# Patient Record
Sex: Female | Born: 1955
Health system: Southern US, Community
[De-identification: ages and names within clinical notes are randomized; demographics above are authoritative.]

## PROBLEM LIST (undated history)

## (undated) DIAGNOSIS — T8859XA Other complications of anesthesia, initial encounter: Secondary | ICD-10-CM

## (undated) DIAGNOSIS — T4145XA Adverse effect of unspecified anesthetic, initial encounter: Secondary | ICD-10-CM

## (undated) DIAGNOSIS — K219 Gastro-esophageal reflux disease without esophagitis: Secondary | ICD-10-CM

## (undated) DIAGNOSIS — R0600 Dyspnea, unspecified: Secondary | ICD-10-CM

## (undated) DIAGNOSIS — R928 Other abnormal and inconclusive findings on diagnostic imaging of breast: Secondary | ICD-10-CM

## (undated) DIAGNOSIS — R7303 Prediabetes: Secondary | ICD-10-CM

## (undated) DIAGNOSIS — R112 Nausea with vomiting, unspecified: Secondary | ICD-10-CM

## (undated) DIAGNOSIS — I10 Essential (primary) hypertension: Secondary | ICD-10-CM

## (undated) DIAGNOSIS — R062 Wheezing: Secondary | ICD-10-CM

## (undated) DIAGNOSIS — Z9889 Other specified postprocedural states: Secondary | ICD-10-CM

## (undated) HISTORY — PX: APPENDECTOMY: SHX54

## (undated) HISTORY — PX: ABDOMINAL HYSTERECTOMY: SHX81

## (undated) HISTORY — PX: BREAST CYST EXCISION: SHX579

## (undated) HISTORY — PX: BREAST BIOPSY: SHX20

## (undated) HISTORY — PX: KNEE SURGERY: SHX244

## (undated) HISTORY — PX: SHOULDER SURGERY: SHX246

## (undated) HISTORY — PX: OVARIAN CYST SURGERY: SHX726

## (undated) HISTORY — PX: ANKLE FRACTURE SURGERY: SHX122

---

## 2000-02-16 ENCOUNTER — Other Ambulatory Visit: Admission: RE | Admit: 2000-02-16 | Discharge: 2000-02-16 | Payer: Self-pay | Admitting: Gynecology

## 2003-02-13 ENCOUNTER — Other Ambulatory Visit: Admission: RE | Admit: 2003-02-13 | Discharge: 2003-02-13 | Payer: Self-pay | Admitting: Gynecology

## 2009-07-05 ENCOUNTER — Ambulatory Visit (HOSPITAL_COMMUNITY): Admission: RE | Admit: 2009-07-05 | Discharge: 2009-07-05 | Payer: Self-pay | Admitting: Gynecology

## 2015-09-17 ENCOUNTER — Other Ambulatory Visit: Payer: Self-pay

## 2015-09-17 DIAGNOSIS — Z1231 Encounter for screening mammogram for malignant neoplasm of breast: Secondary | ICD-10-CM

## 2015-10-29 ENCOUNTER — Ambulatory Visit
Admission: RE | Admit: 2015-10-29 | Discharge: 2015-10-29 | Disposition: A | Discharge status: Home / Self Care | Payer: BLUE CROSS/BLUE SHIELD | Source: Ambulatory Visit

## 2015-10-29 DIAGNOSIS — Z1231 Encounter for screening mammogram for malignant neoplasm of breast: Secondary | ICD-10-CM

## 2016-05-25 DIAGNOSIS — H6981 Other specified disorders of Eustachian tube, right ear: Secondary | ICD-10-CM | POA: Diagnosis not present

## 2016-05-25 DIAGNOSIS — H9311 Tinnitus, right ear: Secondary | ICD-10-CM | POA: Diagnosis not present

## 2016-05-25 DIAGNOSIS — H9190 Unspecified hearing loss, unspecified ear: Secondary | ICD-10-CM | POA: Diagnosis not present

## 2016-05-25 DIAGNOSIS — H918X1 Other specified hearing loss, right ear: Secondary | ICD-10-CM | POA: Diagnosis not present

## 2016-05-25 DIAGNOSIS — K219 Gastro-esophageal reflux disease without esophagitis: Secondary | ICD-10-CM | POA: Diagnosis not present

## 2016-09-01 DIAGNOSIS — D539 Nutritional anemia, unspecified: Secondary | ICD-10-CM | POA: Diagnosis not present

## 2016-09-01 DIAGNOSIS — K210 Gastro-esophageal reflux disease with esophagitis: Secondary | ICD-10-CM | POA: Diagnosis not present

## 2016-09-01 DIAGNOSIS — R1013 Epigastric pain: Secondary | ICD-10-CM | POA: Diagnosis not present

## 2016-09-10 DIAGNOSIS — D649 Anemia, unspecified: Secondary | ICD-10-CM | POA: Diagnosis not present

## 2016-09-10 DIAGNOSIS — R1011 Right upper quadrant pain: Secondary | ICD-10-CM | POA: Diagnosis not present

## 2016-09-10 DIAGNOSIS — R1013 Epigastric pain: Secondary | ICD-10-CM | POA: Diagnosis not present

## 2016-09-10 DIAGNOSIS — K625 Hemorrhage of anus and rectum: Secondary | ICD-10-CM | POA: Diagnosis not present

## 2016-09-15 DIAGNOSIS — D485 Neoplasm of uncertain behavior of skin: Secondary | ICD-10-CM | POA: Diagnosis not present

## 2016-09-15 DIAGNOSIS — L728 Other follicular cysts of the skin and subcutaneous tissue: Secondary | ICD-10-CM | POA: Diagnosis not present

## 2016-09-15 DIAGNOSIS — D0359 Melanoma in situ of other part of trunk: Secondary | ICD-10-CM | POA: Diagnosis not present

## 2016-09-15 DIAGNOSIS — D225 Melanocytic nevi of trunk: Secondary | ICD-10-CM | POA: Diagnosis not present

## 2016-09-15 DIAGNOSIS — D2239 Melanocytic nevi of other parts of face: Secondary | ICD-10-CM | POA: Diagnosis not present

## 2016-09-16 DIAGNOSIS — K2950 Unspecified chronic gastritis without bleeding: Secondary | ICD-10-CM | POA: Diagnosis not present

## 2016-09-16 DIAGNOSIS — K317 Polyp of stomach and duodenum: Secondary | ICD-10-CM | POA: Diagnosis not present

## 2016-09-16 DIAGNOSIS — R1013 Epigastric pain: Secondary | ICD-10-CM | POA: Diagnosis not present

## 2016-09-18 DIAGNOSIS — R1011 Right upper quadrant pain: Secondary | ICD-10-CM | POA: Diagnosis not present

## 2016-09-30 DIAGNOSIS — M7741 Metatarsalgia, right foot: Secondary | ICD-10-CM | POA: Diagnosis not present

## 2016-09-30 DIAGNOSIS — M24572 Contracture, left ankle: Secondary | ICD-10-CM | POA: Diagnosis not present

## 2016-09-30 DIAGNOSIS — M79671 Pain in right foot: Secondary | ICD-10-CM | POA: Diagnosis not present

## 2016-09-30 DIAGNOSIS — M79672 Pain in left foot: Secondary | ICD-10-CM | POA: Diagnosis not present

## 2016-09-30 DIAGNOSIS — M24571 Contracture, right ankle: Secondary | ICD-10-CM | POA: Diagnosis not present

## 2016-10-01 DIAGNOSIS — R1010 Upper abdominal pain, unspecified: Secondary | ICD-10-CM | POA: Diagnosis not present

## 2016-10-01 DIAGNOSIS — K589 Irritable bowel syndrome without diarrhea: Secondary | ICD-10-CM | POA: Diagnosis not present

## 2016-10-01 DIAGNOSIS — D649 Anemia, unspecified: Secondary | ICD-10-CM | POA: Diagnosis not present

## 2016-10-02 DIAGNOSIS — D649 Anemia, unspecified: Secondary | ICD-10-CM | POA: Diagnosis not present

## 2016-10-05 DIAGNOSIS — R1909 Other intra-abdominal and pelvic swelling, mass and lump: Secondary | ICD-10-CM | POA: Diagnosis not present

## 2016-10-07 DIAGNOSIS — Z23 Encounter for immunization: Secondary | ICD-10-CM | POA: Diagnosis not present

## 2016-10-29 DIAGNOSIS — K219 Gastro-esophageal reflux disease without esophagitis: Secondary | ICD-10-CM | POA: Diagnosis not present

## 2016-10-29 DIAGNOSIS — J320 Chronic maxillary sinusitis: Secondary | ICD-10-CM | POA: Diagnosis not present

## 2016-11-17 ENCOUNTER — Other Ambulatory Visit: Payer: Self-pay | Admitting: Family Medicine

## 2016-11-17 DIAGNOSIS — Z1231 Encounter for screening mammogram for malignant neoplasm of breast: Secondary | ICD-10-CM

## 2016-12-10 ENCOUNTER — Ambulatory Visit
Admission: RE | Admit: 2016-12-10 | Discharge: 2016-12-10 | Disposition: A | Discharge status: Home / Self Care | Payer: 59 | Source: Ambulatory Visit | Attending: Family Medicine | Admitting: Family Medicine

## 2016-12-10 ENCOUNTER — Ambulatory Visit: Payer: BLUE CROSS/BLUE SHIELD

## 2016-12-10 DIAGNOSIS — Z1231 Encounter for screening mammogram for malignant neoplasm of breast: Secondary | ICD-10-CM

## 2017-01-22 DIAGNOSIS — J0120 Acute ethmoidal sinusitis, unspecified: Secondary | ICD-10-CM | POA: Diagnosis not present

## 2017-01-22 DIAGNOSIS — Z6837 Body mass index (BMI) 37.0-37.9, adult: Secondary | ICD-10-CM | POA: Diagnosis not present

## 2017-01-22 DIAGNOSIS — J309 Allergic rhinitis, unspecified: Secondary | ICD-10-CM | POA: Diagnosis not present

## 2017-03-12 DIAGNOSIS — D500 Iron deficiency anemia secondary to blood loss (chronic): Secondary | ICD-10-CM | POA: Diagnosis not present

## 2017-03-15 DIAGNOSIS — R102 Pelvic and perineal pain: Secondary | ICD-10-CM | POA: Diagnosis not present

## 2017-03-15 DIAGNOSIS — Z6837 Body mass index (BMI) 37.0-37.9, adult: Secondary | ICD-10-CM | POA: Diagnosis not present

## 2017-03-18 DIAGNOSIS — K589 Irritable bowel syndrome without diarrhea: Secondary | ICD-10-CM | POA: Diagnosis not present

## 2017-03-18 DIAGNOSIS — D500 Iron deficiency anemia secondary to blood loss (chronic): Secondary | ICD-10-CM | POA: Diagnosis not present

## 2017-03-18 DIAGNOSIS — K219 Gastro-esophageal reflux disease without esophagitis: Secondary | ICD-10-CM | POA: Diagnosis not present

## 2017-04-20 DIAGNOSIS — K648 Other hemorrhoids: Secondary | ICD-10-CM | POA: Diagnosis not present

## 2017-05-10 DIAGNOSIS — Z6836 Body mass index (BMI) 36.0-36.9, adult: Secondary | ICD-10-CM | POA: Diagnosis not present

## 2017-05-10 DIAGNOSIS — G4733 Obstructive sleep apnea (adult) (pediatric): Secondary | ICD-10-CM | POA: Diagnosis not present

## 2017-05-10 DIAGNOSIS — G4700 Insomnia, unspecified: Secondary | ICD-10-CM | POA: Diagnosis not present

## 2017-05-10 DIAGNOSIS — R531 Weakness: Secondary | ICD-10-CM | POA: Diagnosis not present

## 2017-05-25 DIAGNOSIS — Z6836 Body mass index (BMI) 36.0-36.9, adult: Secondary | ICD-10-CM | POA: Diagnosis not present

## 2017-05-25 DIAGNOSIS — N760 Acute vaginitis: Secondary | ICD-10-CM | POA: Diagnosis not present

## 2017-05-25 DIAGNOSIS — N898 Other specified noninflammatory disorders of vagina: Secondary | ICD-10-CM | POA: Diagnosis not present

## 2017-06-18 DIAGNOSIS — Z01419 Encounter for gynecological examination (general) (routine) without abnormal findings: Secondary | ICD-10-CM | POA: Diagnosis not present

## 2017-06-18 DIAGNOSIS — Z6837 Body mass index (BMI) 37.0-37.9, adult: Secondary | ICD-10-CM | POA: Diagnosis not present

## 2017-06-18 DIAGNOSIS — Z0000 Encounter for general adult medical examination without abnormal findings: Secondary | ICD-10-CM | POA: Diagnosis not present

## 2017-06-18 DIAGNOSIS — Z1272 Encounter for screening for malignant neoplasm of vagina: Secondary | ICD-10-CM | POA: Diagnosis not present

## 2017-06-21 ENCOUNTER — Other Ambulatory Visit: Payer: Self-pay | Admitting: Obstetrics & Gynecology

## 2017-06-21 DIAGNOSIS — N63 Unspecified lump in unspecified breast: Secondary | ICD-10-CM

## 2017-06-24 ENCOUNTER — Ambulatory Visit
Admission: RE | Admit: 2017-06-24 | Discharge: 2017-06-24 | Disposition: A | Discharge status: Home / Self Care | Payer: BLUE CROSS/BLUE SHIELD | Source: Ambulatory Visit | Attending: Obstetrics & Gynecology | Admitting: Obstetrics & Gynecology

## 2017-06-24 DIAGNOSIS — N6489 Other specified disorders of breast: Secondary | ICD-10-CM | POA: Diagnosis not present

## 2017-06-24 DIAGNOSIS — N63 Unspecified lump in unspecified breast: Secondary | ICD-10-CM

## 2017-06-24 DIAGNOSIS — R928 Other abnormal and inconclusive findings on diagnostic imaging of breast: Secondary | ICD-10-CM | POA: Diagnosis not present

## 2017-07-01 DIAGNOSIS — Z8041 Family history of malignant neoplasm of ovary: Secondary | ICD-10-CM | POA: Diagnosis not present

## 2017-07-01 DIAGNOSIS — Z6837 Body mass index (BMI) 37.0-37.9, adult: Secondary | ICD-10-CM | POA: Diagnosis not present

## 2017-07-26 DIAGNOSIS — J40 Bronchitis, not specified as acute or chronic: Secondary | ICD-10-CM | POA: Diagnosis not present

## 2017-07-26 DIAGNOSIS — J329 Chronic sinusitis, unspecified: Secondary | ICD-10-CM | POA: Diagnosis not present

## 2017-07-26 DIAGNOSIS — J309 Allergic rhinitis, unspecified: Secondary | ICD-10-CM | POA: Diagnosis not present

## 2017-07-26 DIAGNOSIS — Z6838 Body mass index (BMI) 38.0-38.9, adult: Secondary | ICD-10-CM | POA: Diagnosis not present

## 2017-08-11 DIAGNOSIS — Z6837 Body mass index (BMI) 37.0-37.9, adult: Secondary | ICD-10-CM | POA: Diagnosis not present

## 2017-08-11 DIAGNOSIS — Z8041 Family history of malignant neoplasm of ovary: Secondary | ICD-10-CM | POA: Diagnosis not present

## 2017-08-24 DIAGNOSIS — G4733 Obstructive sleep apnea (adult) (pediatric): Secondary | ICD-10-CM | POA: Diagnosis not present

## 2017-09-08 DIAGNOSIS — D500 Iron deficiency anemia secondary to blood loss (chronic): Secondary | ICD-10-CM | POA: Diagnosis not present

## 2017-09-14 DIAGNOSIS — K589 Irritable bowel syndrome without diarrhea: Secondary | ICD-10-CM | POA: Diagnosis not present

## 2017-09-14 DIAGNOSIS — K219 Gastro-esophageal reflux disease without esophagitis: Secondary | ICD-10-CM | POA: Diagnosis not present

## 2017-09-14 DIAGNOSIS — D500 Iron deficiency anemia secondary to blood loss (chronic): Secondary | ICD-10-CM | POA: Diagnosis not present

## 2017-09-14 DIAGNOSIS — Z1212 Encounter for screening for malignant neoplasm of rectum: Secondary | ICD-10-CM | POA: Diagnosis not present

## 2017-10-04 DIAGNOSIS — Z23 Encounter for immunization: Secondary | ICD-10-CM | POA: Diagnosis not present

## 2017-10-04 DIAGNOSIS — G4733 Obstructive sleep apnea (adult) (pediatric): Secondary | ICD-10-CM | POA: Diagnosis not present

## 2017-10-04 DIAGNOSIS — Z6839 Body mass index (BMI) 39.0-39.9, adult: Secondary | ICD-10-CM | POA: Diagnosis not present

## 2017-10-04 DIAGNOSIS — I10 Essential (primary) hypertension: Secondary | ICD-10-CM | POA: Diagnosis not present

## 2017-10-04 DIAGNOSIS — G4700 Insomnia, unspecified: Secondary | ICD-10-CM | POA: Diagnosis not present

## 2017-12-07 DIAGNOSIS — F5119 Other hypersomnia not due to a substance or known physiological condition: Secondary | ICD-10-CM | POA: Diagnosis not present

## 2017-12-08 DIAGNOSIS — F5119 Other hypersomnia not due to a substance or known physiological condition: Secondary | ICD-10-CM | POA: Diagnosis not present

## 2017-12-10 DIAGNOSIS — J329 Chronic sinusitis, unspecified: Secondary | ICD-10-CM | POA: Diagnosis not present

## 2017-12-10 DIAGNOSIS — J40 Bronchitis, not specified as acute or chronic: Secondary | ICD-10-CM | POA: Diagnosis not present

## 2017-12-10 DIAGNOSIS — G4700 Insomnia, unspecified: Secondary | ICD-10-CM | POA: Diagnosis not present

## 2017-12-10 DIAGNOSIS — Z6839 Body mass index (BMI) 39.0-39.9, adult: Secondary | ICD-10-CM | POA: Diagnosis not present

## 2018-01-14 ENCOUNTER — Other Ambulatory Visit: Payer: Self-pay | Admitting: Family Medicine

## 2018-01-14 DIAGNOSIS — Z1231 Encounter for screening mammogram for malignant neoplasm of breast: Secondary | ICD-10-CM

## 2018-01-17 ENCOUNTER — Ambulatory Visit
Admission: RE | Admit: 2018-01-17 | Discharge: 2018-01-17 | Disposition: A | Discharge status: Home / Self Care | Payer: BLUE CROSS/BLUE SHIELD | Source: Ambulatory Visit | Attending: Family Medicine | Admitting: Family Medicine

## 2018-01-17 DIAGNOSIS — Z1231 Encounter for screening mammogram for malignant neoplasm of breast: Secondary | ICD-10-CM | POA: Diagnosis not present

## 2018-01-18 ENCOUNTER — Other Ambulatory Visit: Payer: Self-pay | Admitting: Family Medicine

## 2018-01-18 DIAGNOSIS — R928 Other abnormal and inconclusive findings on diagnostic imaging of breast: Secondary | ICD-10-CM

## 2018-01-20 DIAGNOSIS — M79652 Pain in left thigh: Secondary | ICD-10-CM | POA: Diagnosis not present

## 2018-01-20 DIAGNOSIS — S82832A Other fracture of upper and lower end of left fibula, initial encounter for closed fracture: Secondary | ICD-10-CM | POA: Diagnosis not present

## 2018-01-20 DIAGNOSIS — M25552 Pain in left hip: Secondary | ICD-10-CM | POA: Diagnosis not present

## 2018-01-20 DIAGNOSIS — W010XXA Fall on same level from slipping, tripping and stumbling without subsequent striking against object, initial encounter: Secondary | ICD-10-CM | POA: Diagnosis not present

## 2018-01-20 DIAGNOSIS — M25572 Pain in left ankle and joints of left foot: Secondary | ICD-10-CM | POA: Diagnosis not present

## 2018-01-20 DIAGNOSIS — S79922A Unspecified injury of left thigh, initial encounter: Secondary | ICD-10-CM | POA: Diagnosis not present

## 2018-01-20 DIAGNOSIS — S79912A Unspecified injury of left hip, initial encounter: Secondary | ICD-10-CM | POA: Diagnosis not present

## 2018-01-20 DIAGNOSIS — S8262XA Displaced fracture of lateral malleolus of left fibula, initial encounter for closed fracture: Secondary | ICD-10-CM | POA: Diagnosis not present

## 2018-01-21 ENCOUNTER — Other Ambulatory Visit: Payer: BLUE CROSS/BLUE SHIELD

## 2018-01-21 DIAGNOSIS — S82402A Unspecified fracture of shaft of left fibula, initial encounter for closed fracture: Secondary | ICD-10-CM | POA: Diagnosis not present

## 2018-01-21 DIAGNOSIS — S82832A Other fracture of upper and lower end of left fibula, initial encounter for closed fracture: Secondary | ICD-10-CM | POA: Diagnosis not present

## 2018-01-24 DIAGNOSIS — R52 Pain, unspecified: Secondary | ICD-10-CM | POA: Diagnosis not present

## 2018-01-24 DIAGNOSIS — M79609 Pain in unspecified limb: Secondary | ICD-10-CM | POA: Diagnosis not present

## 2018-01-24 DIAGNOSIS — S82842A Displaced bimalleolar fracture of left lower leg, initial encounter for closed fracture: Secondary | ICD-10-CM | POA: Diagnosis not present

## 2018-01-24 DIAGNOSIS — Z01818 Encounter for other preprocedural examination: Secondary | ICD-10-CM | POA: Diagnosis not present

## 2018-01-26 DIAGNOSIS — R262 Difficulty in walking, not elsewhere classified: Secondary | ICD-10-CM | POA: Diagnosis not present

## 2018-01-26 DIAGNOSIS — E669 Obesity, unspecified: Secondary | ICD-10-CM | POA: Diagnosis not present

## 2018-01-26 DIAGNOSIS — Z79899 Other long term (current) drug therapy: Secondary | ICD-10-CM | POA: Diagnosis not present

## 2018-01-26 DIAGNOSIS — G8918 Other acute postprocedural pain: Secondary | ICD-10-CM | POA: Diagnosis not present

## 2018-01-26 DIAGNOSIS — Z01818 Encounter for other preprocedural examination: Secondary | ICD-10-CM | POA: Diagnosis not present

## 2018-01-26 DIAGNOSIS — S82842A Displaced bimalleolar fracture of left lower leg, initial encounter for closed fracture: Secondary | ICD-10-CM | POA: Diagnosis not present

## 2018-01-26 DIAGNOSIS — Z6839 Body mass index (BMI) 39.0-39.9, adult: Secondary | ICD-10-CM | POA: Diagnosis not present

## 2018-01-26 DIAGNOSIS — I10 Essential (primary) hypertension: Secondary | ICD-10-CM | POA: Diagnosis not present

## 2018-01-26 DIAGNOSIS — X501XXA Overexertion from prolonged static or awkward postures, initial encounter: Secondary | ICD-10-CM | POA: Diagnosis not present

## 2018-02-08 DIAGNOSIS — S82842A Displaced bimalleolar fracture of left lower leg, initial encounter for closed fracture: Secondary | ICD-10-CM | POA: Diagnosis not present

## 2018-03-08 DIAGNOSIS — S82842D Displaced bimalleolar fracture of left lower leg, subsequent encounter for closed fracture with routine healing: Secondary | ICD-10-CM | POA: Diagnosis not present

## 2018-03-11 DIAGNOSIS — M25572 Pain in left ankle and joints of left foot: Secondary | ICD-10-CM | POA: Diagnosis not present

## 2018-03-16 ENCOUNTER — Other Ambulatory Visit: Payer: BLUE CROSS/BLUE SHIELD

## 2018-03-16 DIAGNOSIS — S62134A Nondisplaced fracture of capitate [os magnum] bone, right wrist, initial encounter for closed fracture: Secondary | ICD-10-CM | POA: Diagnosis not present

## 2018-03-17 DIAGNOSIS — R2689 Other abnormalities of gait and mobility: Secondary | ICD-10-CM | POA: Diagnosis not present

## 2018-03-17 DIAGNOSIS — M6281 Muscle weakness (generalized): Secondary | ICD-10-CM | POA: Diagnosis not present

## 2018-03-17 DIAGNOSIS — M25572 Pain in left ankle and joints of left foot: Secondary | ICD-10-CM | POA: Diagnosis not present

## 2018-03-18 ENCOUNTER — Ambulatory Visit
Admission: RE | Admit: 2018-03-18 | Discharge: 2018-03-18 | Disposition: A | Discharge status: Home / Self Care | Payer: BLUE CROSS/BLUE SHIELD | Source: Ambulatory Visit | Attending: Family Medicine | Admitting: Family Medicine

## 2018-03-18 ENCOUNTER — Other Ambulatory Visit: Payer: Self-pay | Admitting: Family Medicine

## 2018-03-18 DIAGNOSIS — R928 Other abnormal and inconclusive findings on diagnostic imaging of breast: Secondary | ICD-10-CM

## 2018-03-18 DIAGNOSIS — N651 Disproportion of reconstructed breast: Secondary | ICD-10-CM | POA: Diagnosis not present

## 2018-03-18 DIAGNOSIS — N6489 Other specified disorders of breast: Secondary | ICD-10-CM

## 2018-03-22 ENCOUNTER — Ambulatory Visit
Admission: RE | Admit: 2018-03-22 | Discharge: 2018-03-22 | Disposition: A | Discharge status: Home / Self Care | Payer: BLUE CROSS/BLUE SHIELD | Source: Ambulatory Visit | Attending: Family Medicine | Admitting: Family Medicine

## 2018-03-22 ENCOUNTER — Other Ambulatory Visit: Payer: Self-pay | Admitting: Family Medicine

## 2018-03-22 DIAGNOSIS — N6489 Other specified disorders of breast: Secondary | ICD-10-CM | POA: Diagnosis not present

## 2018-03-22 DIAGNOSIS — N6011 Diffuse cystic mastopathy of right breast: Secondary | ICD-10-CM | POA: Diagnosis not present

## 2018-03-23 DIAGNOSIS — M6281 Muscle weakness (generalized): Secondary | ICD-10-CM | POA: Diagnosis not present

## 2018-03-23 DIAGNOSIS — R2689 Other abnormalities of gait and mobility: Secondary | ICD-10-CM | POA: Diagnosis not present

## 2018-03-23 DIAGNOSIS — M25572 Pain in left ankle and joints of left foot: Secondary | ICD-10-CM | POA: Diagnosis not present

## 2018-03-29 ENCOUNTER — Other Ambulatory Visit: Payer: Self-pay | Admitting: General Surgery

## 2018-03-29 DIAGNOSIS — R928 Other abnormal and inconclusive findings on diagnostic imaging of breast: Secondary | ICD-10-CM

## 2018-03-29 DIAGNOSIS — Z79899 Other long term (current) drug therapy: Secondary | ICD-10-CM | POA: Diagnosis not present

## 2018-03-29 DIAGNOSIS — Z87898 Personal history of other specified conditions: Secondary | ICD-10-CM | POA: Diagnosis not present

## 2018-03-30 DIAGNOSIS — M85861 Other specified disorders of bone density and structure, right lower leg: Secondary | ICD-10-CM | POA: Diagnosis not present

## 2018-03-30 DIAGNOSIS — E2839 Other primary ovarian failure: Secondary | ICD-10-CM | POA: Diagnosis not present

## 2018-03-30 DIAGNOSIS — M85851 Other specified disorders of bone density and structure, right thigh: Secondary | ICD-10-CM | POA: Diagnosis not present

## 2018-03-31 ENCOUNTER — Encounter (HOSPITAL_BASED_OUTPATIENT_CLINIC_OR_DEPARTMENT_OTHER): Payer: Self-pay | Admitting: *Deleted

## 2018-03-31 DIAGNOSIS — M6281 Muscle weakness (generalized): Secondary | ICD-10-CM | POA: Diagnosis not present

## 2018-03-31 DIAGNOSIS — M25572 Pain in left ankle and joints of left foot: Secondary | ICD-10-CM | POA: Diagnosis not present

## 2018-03-31 DIAGNOSIS — R2689 Other abnormalities of gait and mobility: Secondary | ICD-10-CM | POA: Diagnosis not present

## 2018-04-01 NOTE — H&P (View-Only) (Signed)
Victoria Lang Documented: 03/29/2018 8:51 AM Location: [facility] Patient #: [id number] DOB: 16-May-1956 Married / Language: Cleophus Molt / Race: White Female   History of Present Illness [name] MD; 03/29/2018 9:45 AM) The patient is a 62 year old female who presents with a complaint of Breast problems. Pt is a lovely 62 yo M referred for consultation by Dr. Jimmye Norman for an abnormal right mammogram. She had screening detected architectural distortion which persisted on diagnostic mammogram. U/S was negative. Core needle biopsy showed fibrocystic tissue and was discordant. She presents for discussion. She has not had prior breast complaints. She has a sister who has had breast cancer. She has no personal cancer history. She has had a colon polyp in the past. She is in a boot on the left for an ankle fracture in march. She had surgery at that time which delayed her dx mammogram.  dx mammogram 03/18/18 ACR Breast Density Category b: There are scattered areas of fibroglandular density.  FINDINGS: An area of questionable distortion in the superior slightly lateral right breast at middle depth persists on today's additional spot and 3D views. Further evaluation of this area was performed with ultrasound.  Mammographic images were processed with CAD.  Targeted ultrasound is performed, showing no definite sonographic correlate for the area of distortion seen mammographically. Evaluation of the entire superior aspect was performed. Evaluation of the right axilla demonstrates no suspicious lymphadenopathy.  IMPRESSION: 1. Persistent right breast distortion without ultrasound correlate. Recommendation is for stereotactic biopsy. 2. No suspicious right axillary lymphadenopathy.  RECOMMENDATION: Stereotactic biopsy of the right breast.  I have discussed the findings and recommendations with the patient. Results were also provided in writing at the conclusion of the visit.  If applicable, a reminder letter will be sent to the patient regarding the next appointment.  BI-RADS CATEGORY 4: Suspicious.  pathology 03/22/18 Diagnosis Breast, right, needle core biopsy, upper outer - FIBROCYSTIC CHANGE WITH CALCIFICATIONS. - NO MALIGNANCY IDENTIFIED.   Past Surgical History (Tanisha A. Owens Shark, [place]; 03/29/2018 8:52 AM) Appendectomy  Breast Biopsy  Right. Cesarean Section - 1  Colon Polyp Removal - Colonoscopy  Foot Surgery  Left. Hysterectomy (not due to cancer) - Partial  Knee Surgery  Left. Shoulder Surgery  Bilateral.  Diagnostic Studies History (Tanisha A. Owens Shark, [facility]; 03/29/2018 8:51 AM) Colonoscopy  within last year Mammogram  within last year Pap Smear  >5 years ago  Allergies (Tanisha A. Owens Shark, [place]; 03/29/2018 8:54 AM) No Known Drug Allergies [03/29/2018]: Allergies Reconciled   Medication History (Tanisha A. Owens Shark, [place]; 03/29/2018 8:55 AM) AmLODIPine Besylate (5MG  Tablet, Oral) Active. Aspirin (325MG  Tablet DR, Oral) Active. Estradiol (1MG  Tablet, Oral) Active. Pantoprazole Sodium (40MG  Tablet DR, Oral) Active. Vitamin D (Ergocalciferol) (50000UNIT Capsule, Oral) Active. HydroCHLOROthiazide (12.5MG  Tablet, Oral) Active. Vitamin B12 (1000MCG Tablet ER, Oral) Active. Medications Reconciled  Social History (Tanisha A. Owens Shark, [place]; 03/29/2018 8:52 AM) Caffeine use  Carbonated beverages, Tea. No alcohol use  No drug use  Tobacco use  Never smoker.  Family History (Tanisha A. Owens Shark, [place]; 03/29/2018 8:52 AM) Alcohol Abuse  Father. Arthritis  Mother, Sister. Breast Cancer  Sister. Cancer  Mother, Sister. Colon Polyps  Brother. Diabetes Mellitus  Brother, Father, Mother, Sister. Heart Disease  Mother, Sister. Hypertension  Mother, Sister. Kidney Disease  Mother. Migraine Headache  Mother, Sister. Respiratory Condition  Brother, Sister. Thyroid problems  Sister.  Pregnancy / Birth History (Tanisha A. Owens Shark,  [place]; 03/29/2018 8:52 AM) Age at menarche  12 years. Age of menopause  68-55 Gravida  3 Maternal age  90-30 Para  2  Other Problems (Tanisha A. Owens Shark, [place]; 03/29/2018 8:52 AM) Back Pain  Diverticulosis  Gastric Ulcer  Gastroesophageal Reflux Disease  General anesthesia - complications  Hemorrhoids  High blood pressure  Hypercholesterolemia  Melanoma  Oophorectomy  Left. Sleep Apnea     Review of Systems (Tanisha A. Brown RMA; 03/29/2018 8:52 AM) General Present- Fatigue. Not Present- Appetite Loss, Chills, Fever, Night Sweats, Weight Gain and Weight Loss. Skin Present- Dryness. Not Present- Change in Wart/Mole, Hives, Jaundice, New Lesions, Non-Healing Wounds, Rash and Ulcer. HEENT Present- Wears glasses/contact lenses. Not Present- Earache, Hearing Loss, Hoarseness, Nose Bleed, Oral Ulcers, Ringing in the Ears, Seasonal Allergies, Sinus Pain, Sore Throat, Visual Disturbances and Yellow Eyes. Respiratory Not Present- Bloody sputum, Chronic Cough, Difficulty Breathing, Snoring and Wheezing. Breast Not Present- Breast Mass, Breast Pain, Nipple Discharge and Skin Changes. Cardiovascular Present- Swelling of Extremities. Not Present- Chest Pain, Difficulty Breathing Lying Down, Leg Cramps, Palpitations, Rapid Heart Rate and Shortness of Breath. Gastrointestinal Present- Hemorrhoids. Not Present- Abdominal Pain, Bloating, Bloody Stool, Change in Bowel Habits, Chronic diarrhea, Constipation, Difficulty Swallowing, Excessive gas, Gets full quickly at meals, Indigestion, Nausea, Rectal Pain and Vomiting. Female Genitourinary Not Present- Frequency, Nocturia, Painful Urination, Pelvic Pain and Urgency. Musculoskeletal Present- Back Pain. Not Present- Joint Pain, Joint Stiffness, Muscle Pain, Muscle Weakness and Swelling of Extremities. Neurological Not Present- Decreased Memory, Fainting, Headaches, Numbness, Seizures, Tingling, Tremor, Trouble walking and Weakness. Psychiatric  Not Present- Anxiety, Bipolar, Change in Sleep Pattern, Depression, Fearful and Frequent crying. Endocrine Present- Heat Intolerance. Not Present- Cold Intolerance, Excessive Hunger, Hair Changes, Hot flashes and New Diabetes. Hematology Present- Easy Bruising. Not Present- Blood Thinners, Excessive bleeding, Gland problems, HIV and Persistent Infections.  Vitals (Tanisha A. Brown RMA; 03/29/2018 8:52 AM) 03/29/2018 8:52 AM Weight: 224.8 lb Height: 65in Body Surface Area: 2.08 m Body Mass Index: 37.41 kg/m  Temp.: 97.51F  Pulse: 105 (Regular)  BP: 132/84 (Sitting, Left Arm, Standard)       Physical Exam [name] MD; 03/29/2018 9:45 AM) General Mental Status-Alert. General Appearance-Consistent with stated age. Hydration-Well hydrated. Voice-Normal.  Head and Neck Head-normocephalic, atraumatic with no lesions or palpable masses. Trachea-midline. Thyroid Gland Characteristics - normal size and consistency.  Eye Eyeball - Bilateral-Extraocular movements intact. Sclera/Conjunctiva - Bilateral-No scleral icterus.  Chest and Lung Exam Chest and lung exam reveals -quiet, even and easy respiratory effort with no use of accessory muscles and on auscultation, normal breath sounds, no adventitious sounds and normal vocal resonance. Inspection Chest Wall - Normal. Back - normal.  Breast Note: right breast with some bruising upper outer quadrant. no palpable masses. no nipple retraction or skin dimpling. no nipple discharge. no LAD. Left breast without positive findings.   Cardiovascular Cardiovascular examination reveals -normal heart sounds, regular rate and rhythm with no murmurs and normal pedal pulses bilaterally.  Abdomen Inspection Inspection of the abdomen reveals - No Hernias. Palpation/Percussion Palpation and Percussion of the abdomen reveal - Soft, Non Tender, No Rebound tenderness, No Rigidity (guarding) and No  hepatosplenomegaly. Auscultation Auscultation of the abdomen reveals - Bowel sounds normal.  Neurologic Neurologic evaluation reveals -alert and oriented x 3 with no impairment of recent or remote memory. Mental Status-Normal.  Musculoskeletal Global Assessment -Note: no gross deformities.  Normal Exam - Left-Upper Extremity Strength Normal and Lower Extremity Strength Normal. Normal Exam - Right-Upper Extremity Strength Normal and Lower Extremity Strength Normal. Note: left lower leg in walking boot  Lymphatic Head & Neck  General Head & Neck Lymphatics: Bilateral - Description - Normal. Axillary  General Axillary Region: Bilateral - Description - Normal. Tenderness - Non Tender. Femoral & Inguinal  Generalized Femoral & Inguinal Lymphatics: Bilateral - Description - No Generalized lymphadenopathy.    Assessment & Plan [name] MD; 03/29/2018 9:47 AM) ABNORMAL MAMMOGRAM OF RIGHT BREAST (R92.8) Impression: Pt has a discordant needle biopsy with abnormal right mammogram. We will plan seed localized excisional biopsy. She desires to have this done as soon as possible.  The surgical procedure was described to the patient. I discussed the incision type and location and that we would need radiology involved on with a wire or seed marker and/or sentinel node.  The risks and benefits of the procedure were described to the patient and she wishes to proceed.  We discussed the risks bleeding, infection, damage to other structures, need for further procedures/surgeries. We discussed the risk of seroma. The patient was advised if the area in the breast in cancer, we may need to go back to surgery for additional tissue to obtain negative margins or for a lymph node biopsy. The patient was advised that these are the most common complications, but that others can occur as well. They were advised against taking aspirin or other anti-inflammatory agents/blood thinners the week  before surgery. Current Plans Pt Education - [facility] Breast Biopsy HCI: discussed with patient and provided information. Schedule for Surgery HISTORY OF POSTOPERATIVE NAUSEA AND VOMITING (L27.517) Impression: anesthesia consult to discuss CURRENT USE OF ESTROGEN THERAPY (G01.749) Impression: Advised that if she has cancer we will recommend stopping this.    Signed by [name], MD (03/29/2018 9:47 AM)

## 2018-04-01 NOTE — H&P (Signed)
Victoria Lang Documented: 03/29/2018 8:51 AM Location: [facility] Patient #: [id number] DOB: 04-25-1956 Married / Language: Victoria Lang / Race: White Female   History of Present Illness Victoria Klein MD; 03/29/2018 9:45 AM) The patient is a 62 year old female who presents with a complaint of Breast problems. Pt is a lovely 62 yo M referred for consultation by Dr. Jimmye Lang for an abnormal right mammogram. She had screening detected architectural distortion which persisted on diagnostic mammogram. U/S was negative. Core needle biopsy showed fibrocystic tissue and was discordant. She presents for discussion. She has not had prior breast complaints. She has a sister who has had breast cancer. She has no personal cancer history. She has had a colon polyp in the past. She is in a boot on the left for an ankle fracture in march. She had surgery at that time which delayed her dx mammogram.  dx mammogram 03/18/18 ACR Breast Density Category b: There are scattered areas of fibroglandular density.  FINDINGS: An area of questionable distortion in the superior slightly lateral right breast at middle depth persists on today's additional spot and 3D views. Further evaluation of this area was performed with ultrasound.  Mammographic images were processed with CAD.  Targeted ultrasound is performed, showing no definite sonographic correlate for the area of distortion seen mammographically. Evaluation of the entire superior aspect was performed. Evaluation of the right axilla demonstrates no suspicious lymphadenopathy.  IMPRESSION: 1. Persistent right breast distortion without ultrasound correlate. Recommendation is for stereotactic biopsy. 2. No suspicious right axillary lymphadenopathy.  RECOMMENDATION: Stereotactic biopsy of the right breast.  I have discussed the findings and recommendations with the patient. Results were also provided in writing at the conclusion of the visit.  If applicable, a reminder letter will be sent to the patient regarding the next appointment.  BI-RADS CATEGORY 4: Suspicious.  pathology 03/22/18 Diagnosis Breast, right, needle core biopsy, upper outer - FIBROCYSTIC CHANGE WITH CALCIFICATIONS. - NO MALIGNANCY IDENTIFIED.   Past Surgical History (Victoria Lang, [place]; 03/29/2018 8:52 AM) Appendectomy  Breast Biopsy  Right. Cesarean Section - 1  Colon Polyp Removal - Colonoscopy  Foot Surgery  Left. Hysterectomy (not due to cancer) - Partial  Knee Surgery  Left. Shoulder Surgery  Bilateral.  Diagnostic Studies History (Victoria Lang, [place]; 03/29/2018 8:51 AM) Colonoscopy  within last year Mammogram  within last year Pap Smear  >5 years ago  Allergies (Victoria Lang, [place]; 03/29/2018 8:54 AM) No Known Drug Allergies [03/29/2018]: Allergies Reconciled   Medication History (Victoria Lang, [place]; 03/29/2018 8:55 AM) AmLODIPine Besylate (5MG  Tablet, Oral) Active. Aspirin (325MG  Tablet DR, Oral) Active. Estradiol (1MG  Tablet, Oral) Active. Pantoprazole Sodium (40MG  Tablet DR, Oral) Active. Vitamin D (Ergocalciferol) (50000UNIT Capsule, Oral) Active. HydroCHLOROthiazide (12.5MG  Tablet, Oral) Active. Vitamin B12 (1000MCG Tablet ER, Oral) Active. Medications Reconciled  Social History (Victoria Lang, [place]; 03/29/2018 8:52 AM) Caffeine use  Carbonated beverages, Tea. No alcohol use  No drug use  Tobacco use  Never smoker.  Family History (Victoria Lang, [place]; 03/29/2018 8:52 AM) Alcohol Abuse  Father. Arthritis  Mother, Sister. Breast Cancer  Sister. Cancer  Mother, Sister. Colon Polyps  Brother. Diabetes Mellitus  Brother, Father, Mother, Sister. Heart Disease  Mother, Sister. Hypertension  Mother, Sister. Kidney Disease  Mother. Migraine Headache  Mother, Sister. Respiratory Condition  Brother, Sister. Thyroid problems  Sister.  Pregnancy / Birth History (Victoria Lang,  [place]; 03/29/2018 8:52 AM) Age at menarche  40 years. Age of menopause  32-55 Gravida  3 Maternal age  58-30 Para  2  Other Problems (Victoria Lang, [place]; 03/29/2018 8:52 AM) Back Pain  Diverticulosis  Gastric Ulcer  Gastroesophageal Reflux Disease  General anesthesia - complications  Hemorrhoids  High blood pressure  Hypercholesterolemia  Melanoma  Oophorectomy  Left. Sleep Apnea     Review of Systems (Victoria Lang; 03/29/2018 8:52 AM) General Present- Fatigue. Not Present- Appetite Loss, Chills, Fever, Night Sweats, Weight Gain and Weight Loss. Skin Present- Dryness. Not Present- Change in Wart/Mole, Hives, Jaundice, New Lesions, Non-Healing Wounds, Rash and Ulcer. HEENT Present- Wears glasses/contact lenses. Not Present- Earache, Hearing Loss, Hoarseness, Nose Bleed, Oral Ulcers, Ringing in the Ears, Seasonal Allergies, Sinus Pain, Sore Throat, Visual Disturbances and Yellow Eyes. Respiratory Not Present- Bloody sputum, Chronic Cough, Difficulty Breathing, Snoring and Wheezing. Breast Not Present- Breast Mass, Breast Pain, Nipple Discharge and Skin Changes. Cardiovascular Present- Swelling of Extremities. Not Present- Chest Pain, Difficulty Breathing Lying Down, Leg Cramps, Palpitations, Rapid Heart Rate and Shortness of Breath. Gastrointestinal Present- Hemorrhoids. Not Present- Abdominal Pain, Bloating, Bloody Stool, Change in Bowel Habits, Chronic diarrhea, Constipation, Difficulty Swallowing, Excessive gas, Gets full quickly at meals, Indigestion, Nausea, Rectal Pain and Vomiting. Female Genitourinary Not Present- Frequency, Nocturia, Painful Urination, Pelvic Pain and Urgency. Musculoskeletal Present- Back Pain. Not Present- Joint Pain, Joint Stiffness, Muscle Pain, Muscle Weakness and Swelling of Extremities. Neurological Not Present- Decreased Memory, Fainting, Headaches, Numbness, Seizures, Tingling, Tremor, Trouble walking and Weakness. Psychiatric  Not Present- Anxiety, Bipolar, Change in Sleep Pattern, Depression, Fearful and Frequent crying. Endocrine Present- Heat Intolerance. Not Present- Cold Intolerance, Excessive Hunger, Hair Changes, Hot flashes and New Diabetes. Hematology Present- Easy Bruising. Not Present- Blood Thinners, Excessive bleeding, Gland problems, HIV and Persistent Infections.  Vitals (Victoria Lang; 03/29/2018 8:52 AM) 03/29/2018 8:52 AM Weight: 224.8 lb Height: 65in Body Surface Area: 2.08 m Body Mass Index: 37.41 kg/m  Temp.: 97.86F  Pulse: 105 (Regular)  BP: 132/84 (Sitting, Left Arm, Standard)       Physical Exam Victoria Klein MD; 03/29/2018 9:45 AM) General Mental Status-Alert. General Appearance-Consistent with stated age. Hydration-Well hydrated. Voice-Normal.  Head and Neck Head-normocephalic, atraumatic with no lesions or palpable masses. Trachea-midline. Thyroid Gland Characteristics - normal size and consistency.  Eye Eyeball - Bilateral-Extraocular movements intact. Sclera/Conjunctiva - Bilateral-No scleral icterus.  Chest and Lung Exam Chest and lung exam reveals -quiet, even and easy respiratory effort with no use of accessory muscles and on auscultation, normal breath sounds, no adventitious sounds and normal vocal resonance. Inspection Chest Wall - Normal. Back - normal.  Breast Note: right breast with some bruising upper outer quadrant. no palpable masses. no nipple retraction or skin dimpling. no nipple discharge. no LAD. Left breast without positive findings.   Cardiovascular Cardiovascular examination reveals -normal heart sounds, regular rate and rhythm with no murmurs and normal pedal pulses bilaterally.  Abdomen Inspection Inspection of the abdomen reveals - No Hernias. Palpation/Percussion Palpation and Percussion of the abdomen reveal - Soft, Non Tender, No Rebound tenderness, No Rigidity (guarding) and No  hepatosplenomegaly. Auscultation Auscultation of the abdomen reveals - Bowel sounds normal.  Neurologic Neurologic evaluation reveals -alert and oriented x 3 with no impairment of recent or remote memory. Mental Status-Normal.  Musculoskeletal Global Assessment -Note: no gross deformities.  Normal Exam - Left-Upper Extremity Strength Normal and Lower Extremity Strength Normal. Normal Exam - Right-Upper Extremity Strength Normal and Lower Extremity Strength Normal. Note: left lower leg in walking boot  Lymphatic Head & Neck  General Head & Neck Lymphatics: Bilateral - Description - Normal. Axillary  General Axillary Region: Bilateral - Description - Normal. Tenderness - Non Tender. Femoral & Inguinal  Generalized Femoral & Inguinal Lymphatics: Bilateral - Description - No Generalized lymphadenopathy.    Assessment & Plan Victoria Klein MD; 03/29/2018 9:47 AM) ABNORMAL MAMMOGRAM OF RIGHT BREAST (R92.8) Impression: Pt has a discordant needle biopsy with abnormal right mammogram. We will plan seed localized excisional biopsy. She desires to have this done as soon as possible.  The surgical procedure was described to the patient. I discussed the incision type and location and that we would need radiology involved on with a wire or seed marker and/or sentinel node.  The risks and benefits of the procedure were described to the patient and she wishes to proceed.  We discussed the risks bleeding, infection, damage to other structures, need for further procedures/surgeries. We discussed the risk of seroma. The patient was advised if the area in the breast in cancer, we may need to go back to surgery for additional tissue to obtain negative margins or for a lymph node biopsy. The patient was advised that these are the most common complications, but that others can occur as well. They were advised against taking aspirin or other anti-inflammatory agents/blood thinners the week  before surgery. Current Plans Pt Education - [facility] Breast Biopsy HCI: discussed with patient and provided information. Schedule for Surgery HISTORY OF POSTOPERATIVE NAUSEA AND VOMITING (Z76.734) Impression: anesthesia consult to discuss CURRENT USE OF ESTROGEN THERAPY (L93.790) Impression: Advised that if she has cancer we will recommend stopping this.    Signed by Victoria Klein, MD (03/29/2018 9:47 AM)

## 2018-04-06 ENCOUNTER — Inpatient Hospital Stay: Admission: RE | Admit: 2018-04-06 | Payer: BLUE CROSS/BLUE SHIELD | Source: Ambulatory Visit

## 2018-04-06 DIAGNOSIS — H6980 Other specified disorders of Eustachian tube, unspecified ear: Secondary | ICD-10-CM | POA: Diagnosis not present

## 2018-04-06 DIAGNOSIS — R42 Dizziness and giddiness: Secondary | ICD-10-CM | POA: Diagnosis not present

## 2018-04-06 DIAGNOSIS — Z6837 Body mass index (BMI) 37.0-37.9, adult: Secondary | ICD-10-CM | POA: Diagnosis not present

## 2018-04-06 DIAGNOSIS — R300 Dysuria: Secondary | ICD-10-CM | POA: Diagnosis not present

## 2018-04-07 ENCOUNTER — Ambulatory Visit (HOSPITAL_BASED_OUTPATIENT_CLINIC_OR_DEPARTMENT_OTHER)
Admission: RE | Admit: 2018-04-07 | Payer: BLUE CROSS/BLUE SHIELD | Source: Ambulatory Visit | Admitting: General Surgery

## 2018-04-07 DIAGNOSIS — R300 Dysuria: Secondary | ICD-10-CM | POA: Diagnosis not present

## 2018-04-07 HISTORY — DX: Gastro-esophageal reflux disease without esophagitis: K21.9

## 2018-04-07 HISTORY — DX: Essential (primary) hypertension: I10

## 2018-04-07 SURGERY — RADIOACTIVE SEED GUIDED BREAST BIOPSY
Anesthesia: General | Site: Breast | Laterality: Right

## 2018-04-13 DIAGNOSIS — M25572 Pain in left ankle and joints of left foot: Secondary | ICD-10-CM | POA: Diagnosis not present

## 2018-04-13 DIAGNOSIS — M6281 Muscle weakness (generalized): Secondary | ICD-10-CM | POA: Diagnosis not present

## 2018-04-13 DIAGNOSIS — R2689 Other abnormalities of gait and mobility: Secondary | ICD-10-CM | POA: Diagnosis not present

## 2018-04-18 DIAGNOSIS — R2689 Other abnormalities of gait and mobility: Secondary | ICD-10-CM | POA: Diagnosis not present

## 2018-04-18 DIAGNOSIS — M25572 Pain in left ankle and joints of left foot: Secondary | ICD-10-CM | POA: Diagnosis not present

## 2018-04-18 DIAGNOSIS — M6281 Muscle weakness (generalized): Secondary | ICD-10-CM | POA: Diagnosis not present

## 2018-04-18 DIAGNOSIS — D500 Iron deficiency anemia secondary to blood loss (chronic): Secondary | ICD-10-CM | POA: Diagnosis not present

## 2018-04-19 DIAGNOSIS — S82832A Other fracture of upper and lower end of left fibula, initial encounter for closed fracture: Secondary | ICD-10-CM | POA: Diagnosis not present

## 2018-04-20 ENCOUNTER — Encounter (HOSPITAL_BASED_OUTPATIENT_CLINIC_OR_DEPARTMENT_OTHER): Payer: Self-pay | Admitting: *Deleted

## 2018-04-20 ENCOUNTER — Other Ambulatory Visit: Payer: Self-pay

## 2018-04-21 DIAGNOSIS — K219 Gastro-esophageal reflux disease without esophagitis: Secondary | ICD-10-CM | POA: Diagnosis not present

## 2018-04-21 DIAGNOSIS — K589 Irritable bowel syndrome without diarrhea: Secondary | ICD-10-CM | POA: Diagnosis not present

## 2018-04-21 DIAGNOSIS — D500 Iron deficiency anemia secondary to blood loss (chronic): Secondary | ICD-10-CM | POA: Diagnosis not present

## 2018-04-25 ENCOUNTER — Encounter (HOSPITAL_BASED_OUTPATIENT_CLINIC_OR_DEPARTMENT_OTHER)
Admission: RE | Admit: 2018-04-25 | Discharge: 2018-04-25 | Disposition: A | Discharge status: Home / Self Care | Payer: BLUE CROSS/BLUE SHIELD | Source: Ambulatory Visit | Attending: General Surgery | Admitting: General Surgery

## 2018-04-25 ENCOUNTER — Other Ambulatory Visit: Payer: Self-pay

## 2018-04-25 ENCOUNTER — Ambulatory Visit
Admission: RE | Admit: 2018-04-25 | Discharge: 2018-04-25 | Disposition: A | Discharge status: Home / Self Care | Payer: BLUE CROSS/BLUE SHIELD | Source: Ambulatory Visit | Attending: General Surgery | Admitting: General Surgery

## 2018-04-25 DIAGNOSIS — R928 Other abnormal and inconclusive findings on diagnostic imaging of breast: Secondary | ICD-10-CM

## 2018-04-25 DIAGNOSIS — Z85820 Personal history of malignant melanoma of skin: Secondary | ICD-10-CM | POA: Diagnosis not present

## 2018-04-25 DIAGNOSIS — I10 Essential (primary) hypertension: Secondary | ICD-10-CM | POA: Diagnosis not present

## 2018-04-25 DIAGNOSIS — Z7982 Long term (current) use of aspirin: Secondary | ICD-10-CM | POA: Diagnosis not present

## 2018-04-25 DIAGNOSIS — N6011 Diffuse cystic mastopathy of right breast: Secondary | ICD-10-CM | POA: Diagnosis not present

## 2018-04-25 DIAGNOSIS — Z6837 Body mass index (BMI) 37.0-37.9, adult: Secondary | ICD-10-CM | POA: Diagnosis not present

## 2018-04-25 DIAGNOSIS — L905 Scar conditions and fibrosis of skin: Secondary | ICD-10-CM | POA: Diagnosis not present

## 2018-04-25 DIAGNOSIS — Z79899 Other long term (current) drug therapy: Secondary | ICD-10-CM | POA: Diagnosis not present

## 2018-04-25 DIAGNOSIS — Z882 Allergy status to sulfonamides status: Secondary | ICD-10-CM | POA: Diagnosis not present

## 2018-04-25 DIAGNOSIS — Z8719 Personal history of other diseases of the digestive system: Secondary | ICD-10-CM | POA: Diagnosis not present

## 2018-04-25 DIAGNOSIS — G4730 Sleep apnea, unspecified: Secondary | ICD-10-CM | POA: Diagnosis not present

## 2018-04-25 DIAGNOSIS — E7800 Pure hypercholesterolemia, unspecified: Secondary | ICD-10-CM | POA: Diagnosis not present

## 2018-04-25 DIAGNOSIS — Z888 Allergy status to other drugs, medicaments and biological substances status: Secondary | ICD-10-CM | POA: Diagnosis not present

## 2018-04-25 DIAGNOSIS — K219 Gastro-esophageal reflux disease without esophagitis: Secondary | ICD-10-CM | POA: Diagnosis not present

## 2018-04-25 DIAGNOSIS — Z803 Family history of malignant neoplasm of breast: Secondary | ICD-10-CM | POA: Diagnosis not present

## 2018-04-25 DIAGNOSIS — Z885 Allergy status to narcotic agent status: Secondary | ICD-10-CM | POA: Diagnosis not present

## 2018-04-25 NOTE — Progress Notes (Signed)
S.Davis RN had Dr. Conrad Ruthton review EKG- ok for surgery.

## 2018-04-26 DIAGNOSIS — S82832A Other fracture of upper and lower end of left fibula, initial encounter for closed fracture: Secondary | ICD-10-CM | POA: Diagnosis not present

## 2018-04-26 NOTE — Anesthesia Preprocedure Evaluation (Addendum)
Anesthesia Evaluation  Patient identified by MRN, date of birth, ID band Patient awake    Reviewed: Allergy & Precautions, NPO status , Patient's Chart, lab work & pertinent test results  History of Anesthesia Complications (+) PONV  Airway Mallampati: II  TM Distance: >3 FB Neck ROM: Full    Dental no notable dental hx. (+) Teeth Intact   Pulmonary neg pulmonary ROS,    Pulmonary exam normal breath sounds clear to auscultation       Cardiovascular Exercise Tolerance: Good hypertension, Pt. on medications Normal cardiovascular exam Rhythm:Regular Rate:Normal     Neuro/Psych negative neurological ROS  negative psych ROS   GI/Hepatic Neg liver ROS, GERD  Medicated,  Endo/Other  Morbid obesity  Renal/GU      Musculoskeletal   Abdominal (+) + obese,   Peds  Hematology   Anesthesia Other Findings   Reproductive/Obstetrics                           No results found for: WBC, HGB, HCT, MCV, PLT  Anesthesia Physical Anesthesia Plan  ASA: II  Anesthesia Plan: General   Post-op Pain Management:    Induction: Intravenous  PONV Risk Score and Plan: 4 or greater and Treatment may vary due to age or medical condition, Ondansetron, Scopolamine patch - Pre-op, Dexamethasone and Propofol infusion  Airway Management Planned: LMA  Additional Equipment:   Intra-op Plan:   Post-operative Plan:   Informed Consent: I have reviewed the patients History and Physical, chart, labs and discussed the procedure including the risks, benefits and alternatives for the proposed anesthesia with the patient or authorized representative who has indicated his/her understanding and acceptance.     Plan Discussed with: CRNA  Anesthesia Plan Comments: (TIVA and 0.5 mcg/Kg Precedex for post op pain)     Anesthesia Quick Evaluation

## 2018-04-27 ENCOUNTER — Ambulatory Visit (HOSPITAL_BASED_OUTPATIENT_CLINIC_OR_DEPARTMENT_OTHER): Payer: BLUE CROSS/BLUE SHIELD | Admitting: Certified Registered"

## 2018-04-27 ENCOUNTER — Ambulatory Visit
Admission: RE | Admit: 2018-04-27 | Discharge: 2018-04-27 | Disposition: A | Discharge status: Home / Self Care | Payer: BLUE CROSS/BLUE SHIELD | Source: Ambulatory Visit | Attending: General Surgery | Admitting: General Surgery

## 2018-04-27 ENCOUNTER — Encounter (HOSPITAL_BASED_OUTPATIENT_CLINIC_OR_DEPARTMENT_OTHER)
Admission: RE | Disposition: A | Discharge status: Home / Self Care | Payer: Self-pay | Source: Ambulatory Visit | Attending: General Surgery

## 2018-04-27 ENCOUNTER — Encounter (HOSPITAL_BASED_OUTPATIENT_CLINIC_OR_DEPARTMENT_OTHER): Payer: Self-pay

## 2018-04-27 ENCOUNTER — Ambulatory Visit (HOSPITAL_BASED_OUTPATIENT_CLINIC_OR_DEPARTMENT_OTHER)
Admission: RE | Admit: 2018-04-27 | Discharge: 2018-04-27 | Disposition: A | Discharge status: Home / Self Care | Payer: BLUE CROSS/BLUE SHIELD | Source: Ambulatory Visit | Attending: General Surgery | Admitting: General Surgery

## 2018-04-27 ENCOUNTER — Other Ambulatory Visit: Payer: Self-pay

## 2018-04-27 DIAGNOSIS — I10 Essential (primary) hypertension: Secondary | ICD-10-CM | POA: Insufficient documentation

## 2018-04-27 DIAGNOSIS — Z6837 Body mass index (BMI) 37.0-37.9, adult: Secondary | ICD-10-CM | POA: Diagnosis not present

## 2018-04-27 DIAGNOSIS — Z79899 Other long term (current) drug therapy: Secondary | ICD-10-CM | POA: Diagnosis not present

## 2018-04-27 DIAGNOSIS — L905 Scar conditions and fibrosis of skin: Secondary | ICD-10-CM | POA: Insufficient documentation

## 2018-04-27 DIAGNOSIS — Z7982 Long term (current) use of aspirin: Secondary | ICD-10-CM | POA: Insufficient documentation

## 2018-04-27 DIAGNOSIS — R921 Mammographic calcification found on diagnostic imaging of breast: Secondary | ICD-10-CM | POA: Diagnosis not present

## 2018-04-27 DIAGNOSIS — N6489 Other specified disorders of breast: Secondary | ICD-10-CM | POA: Diagnosis not present

## 2018-04-27 DIAGNOSIS — Z85820 Personal history of malignant melanoma of skin: Secondary | ICD-10-CM | POA: Insufficient documentation

## 2018-04-27 DIAGNOSIS — Z888 Allergy status to other drugs, medicaments and biological substances status: Secondary | ICD-10-CM | POA: Diagnosis not present

## 2018-04-27 DIAGNOSIS — Z885 Allergy status to narcotic agent status: Secondary | ICD-10-CM | POA: Diagnosis not present

## 2018-04-27 DIAGNOSIS — N6011 Diffuse cystic mastopathy of right breast: Secondary | ICD-10-CM | POA: Insufficient documentation

## 2018-04-27 DIAGNOSIS — Z882 Allergy status to sulfonamides status: Secondary | ICD-10-CM | POA: Insufficient documentation

## 2018-04-27 DIAGNOSIS — Z803 Family history of malignant neoplasm of breast: Secondary | ICD-10-CM | POA: Insufficient documentation

## 2018-04-27 DIAGNOSIS — K219 Gastro-esophageal reflux disease without esophagitis: Secondary | ICD-10-CM | POA: Diagnosis not present

## 2018-04-27 DIAGNOSIS — G4730 Sleep apnea, unspecified: Secondary | ICD-10-CM | POA: Diagnosis not present

## 2018-04-27 DIAGNOSIS — Z8719 Personal history of other diseases of the digestive system: Secondary | ICD-10-CM | POA: Diagnosis not present

## 2018-04-27 DIAGNOSIS — E7800 Pure hypercholesterolemia, unspecified: Secondary | ICD-10-CM | POA: Diagnosis not present

## 2018-04-27 DIAGNOSIS — R928 Other abnormal and inconclusive findings on diagnostic imaging of breast: Secondary | ICD-10-CM

## 2018-04-27 HISTORY — DX: Other specified postprocedural states: R11.2

## 2018-04-27 HISTORY — DX: Adverse effect of unspecified anesthetic, initial encounter: T41.45XA

## 2018-04-27 HISTORY — PX: RADIOACTIVE SEED GUIDED EXCISIONAL BREAST BIOPSY: SHX6490

## 2018-04-27 HISTORY — DX: Other specified postprocedural states: Z98.890

## 2018-04-27 HISTORY — DX: Other abnormal and inconclusive findings on diagnostic imaging of breast: R92.8

## 2018-04-27 HISTORY — DX: Other complications of anesthesia, initial encounter: T88.59XA

## 2018-04-27 SURGERY — RADIOACTIVE SEED GUIDED BREAST BIOPSY
Anesthesia: General | Site: Breast | Laterality: Right

## 2018-04-27 MED ORDER — ACETAMINOPHEN 500 MG PO TABS
ORAL_TABLET | ORAL | Status: AC
  Filled 2018-04-27: qty 2

## 2018-04-27 MED ORDER — MEPERIDINE HCL 25 MG/ML IJ SOLN: 6.2500 mg | INTRAMUSCULAR | Status: DC | PRN

## 2018-04-27 MED ORDER — ACETAMINOPHEN 10 MG/ML IV SOLN: 1000.0000 mg | Freq: Once | INTRAVENOUS | Status: DC | PRN

## 2018-04-27 MED ORDER — DEXAMETHASONE SODIUM PHOSPHATE 10 MG/ML IJ SOLN
INTRAMUSCULAR | Status: AC
Start: 2018-04-27 — End: ?
  Filled 2018-04-27: qty 1

## 2018-04-27 MED ORDER — DEXMEDETOMIDINE HCL IN NACL 200 MCG/50ML IV SOLN
INTRAVENOUS | Status: AC
  Filled 2018-04-27: qty 50

## 2018-04-27 MED ORDER — CEFAZOLIN SODIUM-DEXTROSE 2-4 GM/100ML-% IV SOLN
INTRAVENOUS | Status: AC
  Filled 2018-04-27: qty 100

## 2018-04-27 MED ORDER — CHLORHEXIDINE GLUCONATE CLOTH 2 % EX PADS: 6.0000 | MEDICATED_PAD | Freq: Once | CUTANEOUS | Status: DC

## 2018-04-27 MED ORDER — GABAPENTIN 300 MG PO CAPS
ORAL_CAPSULE | ORAL | Status: AC
  Filled 2018-04-27: qty 1

## 2018-04-27 MED ORDER — MIDAZOLAM HCL 2 MG/2ML IJ SOLN
INTRAMUSCULAR | Status: AC
  Filled 2018-04-27: qty 2

## 2018-04-27 MED ORDER — GABAPENTIN 300 MG PO CAPS
300.0000 mg | ORAL_CAPSULE | ORAL | Status: AC
  Administered 2018-04-27: 300 mg via ORAL

## 2018-04-27 MED ORDER — PROPOFOL 500 MG/50ML IV EMUL
INTRAVENOUS | Status: AC
  Filled 2018-04-27: qty 50

## 2018-04-27 MED ORDER — ONDANSETRON HCL 4 MG/2ML IJ SOLN
INTRAMUSCULAR | Status: AC
  Filled 2018-04-27: qty 2

## 2018-04-27 MED ORDER — DEXMEDETOMIDINE HCL 200 MCG/2ML IV SOLN
INTRAVENOUS | Status: DC | PRN
  Administered 2018-04-27 (×2): 20 ug via INTRAVENOUS

## 2018-04-27 MED ORDER — LIDOCAINE HCL (CARDIAC) PF 100 MG/5ML IV SOSY
PREFILLED_SYRINGE | INTRAVENOUS | Status: AC
  Filled 2018-04-27: qty 5

## 2018-04-27 MED ORDER — CELECOXIB 200 MG PO CAPS
ORAL_CAPSULE | ORAL | Status: AC
  Filled 2018-04-27: qty 1

## 2018-04-27 MED ORDER — PROPOFOL 10 MG/ML IV BOLUS
INTRAVENOUS | Status: DC | PRN
  Administered 2018-04-27: 50 mg via INTRAVENOUS
  Administered 2018-04-27: 100 mg via INTRAVENOUS

## 2018-04-27 MED ORDER — PROMETHAZINE HCL 25 MG/ML IJ SOLN: 6.2500 mg | INTRAMUSCULAR | Status: DC | PRN

## 2018-04-27 MED ORDER — SCOPOLAMINE 1 MG/3DAYS TD PT72: 1.0000 | MEDICATED_PATCH | Freq: Once | TRANSDERMAL | Status: DC | PRN

## 2018-04-27 MED ORDER — FENTANYL CITRATE (PF) 100 MCG/2ML IJ SOLN
50.0000 ug | INTRAMUSCULAR | Status: DC | PRN
  Administered 2018-04-27: 50 ug via INTRAVENOUS

## 2018-04-27 MED ORDER — LACTATED RINGERS IV SOLN
INTRAVENOUS | Status: DC
  Administered 2018-04-27: 10 mL/h via INTRAVENOUS
  Administered 2018-04-27: 09:00:00 via INTRAVENOUS

## 2018-04-27 MED ORDER — PROPOFOL 500 MG/50ML IV EMUL
INTRAVENOUS | Status: DC | PRN
  Administered 2018-04-27: 100 ug/kg/min via INTRAVENOUS

## 2018-04-27 MED ORDER — ONDANSETRON HCL 4 MG/2ML IJ SOLN
INTRAMUSCULAR | Status: DC | PRN
  Administered 2018-04-27 (×2): 4 mg via INTRAVENOUS

## 2018-04-27 MED ORDER — TRAMADOL HCL 50 MG PO TABS: 50.0000 mg | ORAL_TABLET | Freq: Four times a day (QID) | ORAL | 0 refills | Status: AC | PRN

## 2018-04-27 MED ORDER — FENTANYL CITRATE (PF) 100 MCG/2ML IJ SOLN
INTRAMUSCULAR | Status: AC
  Filled 2018-04-27: qty 2

## 2018-04-27 MED ORDER — HYDROCODONE-ACETAMINOPHEN 7.5-325 MG PO TABS: 1.0000 | ORAL_TABLET | Freq: Once | ORAL | Status: DC | PRN

## 2018-04-27 MED ORDER — ACETAMINOPHEN 500 MG PO TABS
1000.0000 mg | ORAL_TABLET | ORAL | Status: AC
  Administered 2018-04-27: 1000 mg via ORAL

## 2018-04-27 MED ORDER — HYDROMORPHONE HCL 1 MG/ML IJ SOLN: 0.2500 mg | INTRAMUSCULAR | Status: DC | PRN

## 2018-04-27 MED ORDER — CELECOXIB 200 MG PO CAPS
200.0000 mg | ORAL_CAPSULE | ORAL | Status: AC
  Administered 2018-04-27: 200 mg via ORAL

## 2018-04-27 MED ORDER — CEFAZOLIN SODIUM-DEXTROSE 2-4 GM/100ML-% IV SOLN
2.0000 g | INTRAVENOUS | Status: AC
  Administered 2018-04-27: 2 g via INTRAVENOUS

## 2018-04-27 MED ORDER — MIDAZOLAM HCL 2 MG/2ML IJ SOLN
1.0000 mg | INTRAMUSCULAR | Status: DC | PRN
  Administered 2018-04-27: 2 mg via INTRAVENOUS

## 2018-04-27 MED ORDER — LIDOCAINE HCL 1 % IJ SOLN
INTRAMUSCULAR | Status: DC | PRN
  Administered 2018-04-27: 60 mL via INTRAMUSCULAR

## 2018-04-27 SURGICAL SUPPLY — 52 items
BINDER BREAST XXLRG (GAUZE/BANDAGES/DRESSINGS) ×2 IMPLANT
BLADE SURG 10 STRL SS (BLADE) ×2 IMPLANT
BLADE SURG 15 STRL LF DISP TIS (BLADE) IMPLANT
BLADE SURG 15 STRL SS (BLADE)
CANISTER SUC SOCK COL 7IN (MISCELLANEOUS) IMPLANT
CANISTER SUCT 1200ML W/VALVE (MISCELLANEOUS) ×2 IMPLANT
CHLORAPREP W/TINT 26ML (MISCELLANEOUS) ×2 IMPLANT
CLIP VESOCCLUDE LG 6/CT (CLIP) ×2 IMPLANT
COVER BACK TABLE 60X90IN (DRAPES) ×2 IMPLANT
COVER MAYO STAND STRL (DRAPES) ×2 IMPLANT
COVER PROBE W GEL 5X96 (DRAPES) ×2 IMPLANT
DECANTER SPIKE VIAL GLASS SM (MISCELLANEOUS) IMPLANT
DERMABOND ADVANCED (GAUZE/BANDAGES/DRESSINGS) ×1
DERMABOND ADVANCED .7 DNX12 (GAUZE/BANDAGES/DRESSINGS) ×1 IMPLANT
DEVICE DUBIN W/COMP PLATE 8390 (MISCELLANEOUS) ×2 IMPLANT
DRAPE LAPAROSCOPIC ABDOMINAL (DRAPES) ×2 IMPLANT
DRAPE UTILITY XL STRL (DRAPES) ×2 IMPLANT
ELECT COATED BLADE 2.86 ST (ELECTRODE) ×2 IMPLANT
ELECT REM PT RETURN 9FT ADLT (ELECTROSURGICAL) ×2
ELECTRODE REM PT RTRN 9FT ADLT (ELECTROSURGICAL) ×1 IMPLANT
GAUZE SPONGE 4X4 12PLY STRL LF (GAUZE/BANDAGES/DRESSINGS) ×2 IMPLANT
GLOVE BIO SURGEON STRL SZ 6 (GLOVE) ×2 IMPLANT
GLOVE BIO SURGEON STRL SZ 6.5 (GLOVE) ×2 IMPLANT
GLOVE BIOGEL PI IND STRL 6.5 (GLOVE) ×1 IMPLANT
GLOVE BIOGEL PI IND STRL 7.0 (GLOVE) ×1 IMPLANT
GLOVE BIOGEL PI INDICATOR 6.5 (GLOVE) ×1
GLOVE BIOGEL PI INDICATOR 7.0 (GLOVE) ×1
GOWN STRL REUS W/ TWL LRG LVL3 (GOWN DISPOSABLE) ×1 IMPLANT
GOWN STRL REUS W/TWL 2XL LVL3 (GOWN DISPOSABLE) ×2 IMPLANT
GOWN STRL REUS W/TWL LRG LVL3 (GOWN DISPOSABLE) ×1
KIT MARKER MARGIN INK (KITS) ×2 IMPLANT
LIGHT WAVEGUIDE WIDE FLAT (MISCELLANEOUS) IMPLANT
NEEDLE HYPO 25X1 1.5 SAFETY (NEEDLE) ×2 IMPLANT
NS IRRIG 1000ML POUR BTL (IV SOLUTION) ×2 IMPLANT
PACK BASIN DAY SURGERY FS (CUSTOM PROCEDURE TRAY) ×2 IMPLANT
PENCIL BUTTON HOLSTER BLD 10FT (ELECTRODE) ×2 IMPLANT
SLEEVE SCD COMPRESS KNEE MED (MISCELLANEOUS) ×2 IMPLANT
SPONGE LAP 18X18 RF (DISPOSABLE) ×2 IMPLANT
STAPLER VISISTAT 35W (STAPLE) IMPLANT
STRIP CLOSURE SKIN 1/2X4 (GAUZE/BANDAGES/DRESSINGS) ×2 IMPLANT
SUT MON AB 4-0 PC3 18 (SUTURE) ×2 IMPLANT
SUT SILK 2 0 SH (SUTURE) IMPLANT
SUT VIC AB 2-0 SH 18 (SUTURE) IMPLANT
SUT VIC AB 3-0 SH 27 (SUTURE) ×1
SUT VIC AB 3-0 SH 27X BRD (SUTURE) ×1 IMPLANT
SUT VICRYL 3-0 CR8 SH (SUTURE) IMPLANT
SYR BULB 3OZ (MISCELLANEOUS) IMPLANT
SYR CONTROL 10ML LL (SYRINGE) ×2 IMPLANT
TOWEL GREEN STERILE FF (TOWEL DISPOSABLE) ×2 IMPLANT
TOWEL OR NON WOVEN STRL DISP B (DISPOSABLE) IMPLANT
TUBE CONNECTING 20X1/4 (TUBING) ×2 IMPLANT
YANKAUER SUCT BULB TIP NO VENT (SUCTIONS) ×2 IMPLANT

## 2018-04-27 NOTE — Interval H&P Note (Signed)
History and Physical Interval Note:  04/27/2018 10:30 AM  Victoria Lang  has presented today for surgery, with the diagnosis of RIGHT BREAST ABNORMAL MAMMOGRAM  The various methods of treatment have been discussed with the patient and family. After consideration of risks, benefits and other options for treatment, the patient has consented to  Procedure(s): RADIOACTIVE SEED GUIDED EXCISIONAL BREAST BIOPSY (Right) as a surgical intervention .  The patient's history has been reviewed, patient examined, no change in status, stable for surgery.  I have reviewed the patient's chart and labs.  Questions were answered to the patient's satisfaction.     [name]

## 2018-04-27 NOTE — Discharge Instructions (Addendum)
[facility] °Office Phone Number [phone] ° °BREAST BIOPSY/ PARTIAL MASTECTOMY: POST OP INSTRUCTIONS ° °Always review your discharge instruction sheet given to you by the facility where your surgery was performed. ° °IF YOU HAVE DISABILITY OR FAMILY LEAVE FORMS, YOU MUST BRING THEM TO THE OFFICE FOR PROCESSING.  DO NOT GIVE THEM TO YOUR DOCTOR. ° °1. A prescription for pain medication may be given to you upon discharge.  Take your pain medication as prescribed, if needed.  If narcotic pain medicine is not needed, then you may take acetaminophen (Tylenol) or ibuprofen (Advil) as needed. °2. Take your usually prescribed medications unless otherwise directed °3. If you need a refill on your pain medication, please contact your pharmacy.  They will contact our office to request authorization.  Prescriptions will not be filled after 5pm or on week-ends. °4. You should eat very light the first 24 hours after surgery, such as soup, crackers, pudding, etc.  Resume your normal diet the day after surgery. °5. Most patients will experience some swelling and bruising in the breast.  Ice packs and a good support bra will help.  Swelling and bruising can take several days to resolve.  °6. It is common to experience some constipation if taking pain medication after surgery.  Increasing fluid intake and taking a stool softener will usually help or prevent this problem from occurring.  A mild laxative (Milk of Magnesia or Miralax) should be taken according to package directions if there are no bowel movements after 48 hours. °7. Unless discharge instructions indicate otherwise, you may remove your bandages 48 hours after surgery, and you may shower at that time.  You may have steri-strips (small skin tapes) in place directly over the incision.  These strips should be left on the skin for 7-10 days.   Any sutures or staples will be removed at the office during your follow-up visit. °8. ACTIVITIES:  You may resume  regular daily activities (gradually increasing) beginning the next day.  Wearing a good support bra or sports bra (or the breast binder) minimizes pain and swelling.  You may have sexual intercourse when it is comfortable. °a. You may drive when you no longer are taking prescription pain medication, you can comfortably wear a seatbelt, and you can safely maneuver your car and apply brakes. °b. RETURN TO WORK:  __________1 week_______________ °9. You should see your doctor in the office for a follow-up appointment approximately two weeks after your surgery.  Your doctor’s nurse will typically make your follow-up appointment when she calls you with your pathology report.  Expect your pathology report 2-3 business days after your surgery.  You may call to check if you do not hear from us after three days. ° ° °WHEN TO CALL YOUR DOCTOR: °1. Fever over 101.0 °2. Nausea and/or vomiting. °3. Extreme swelling or bruising. °4. Continued bleeding from incision. °5. Increased pain, redness, or drainage from the incision. ° °The clinic staff is available to answer your questions during regular business hours.  Please don’t hesitate to call and ask to speak to one of the nurses for clinical concerns.  If you have a medical emergency, go to the nearest emergency room or call 911.  A surgeon from [facility] is always on call at the hospital. ° °For further questions, please visit [email or web address]  ° ° °Post Anesthesia Home Care Instructions ° °Activity: °Get plenty of rest for the remainder of the day. A responsible individual must stay with you for 24   hours following the procedure.  °For the next 24 hours, DO NOT: °-Drive a car °-Operate machinery °-Drink alcoholic beverages °-Take any medication unless instructed by your physician °-Make any legal decisions or sign important papers. ° °Meals: °Start with liquid foods such as gelatin or soup. Progress to regular foods as tolerated. Avoid greasy, spicy,  heavy foods. If nausea and/or vomiting occur, drink only clear liquids until the nausea and/or vomiting subsides. Call your physician if vomiting continues. ° °Special Instructions/Symptoms: °Your throat may feel dry or sore from the anesthesia or the breathing tube placed in your throat during surgery. If this causes discomfort, gargle with warm salt water. The discomfort should disappear within 24 hours. ° °If you had a scopolamine patch placed behind your ear for the management of post- operative nausea and/or vomiting: ° °1. The medication in the patch is effective for 72 hours, after which it should be removed.  Wrap patch in a tissue and discard in the trash. Wash hands thoroughly with soap and water. °2. You may remove the patch earlier than 72 hours if you experience unpleasant side effects which may include dry mouth, dizziness or visual disturbances. °3. Avoid touching the patch. Wash your hands with soap and water after contact with the patch. °  ° °

## 2018-04-27 NOTE — Anesthesia Procedure Notes (Signed)
Procedure Name: LMA Insertion Date/Time: 04/27/2018 10:55 AM Performed by: Lyndee Leo, CRNA Pre-anesthesia Checklist: Patient identified, Emergency Drugs available, Suction available and Patient being monitored Patient Re-evaluated:Patient Re-evaluated prior to induction Oxygen Delivery Method: Circle system utilized Preoxygenation: Pre-oxygenation with 100% oxygen Induction Type: IV induction Ventilation: Mask ventilation without difficulty LMA: LMA inserted LMA Size: 4.0 Number of attempts: 1 Airway Equipment and Method: Bite block Placement Confirmation: positive ETCO2 Tube secured with: Tape Dental Injury: Teeth and Oropharynx as per pre-operative assessment

## 2018-04-27 NOTE — Transfer of Care (Signed)
Immediate Anesthesia Transfer of Care Note  Patient: Victoria Lang  Procedure(s) Performed: RADIOACTIVE SEED GUIDED EXCISIONAL BREAST BIOPSY (Right Breast)  Patient Location: PACU  Anesthesia Type:General  Level of Consciousness: sedated and responds to stimulation  Airway & Oxygen Therapy: Patient Spontanous Breathing and Patient connected to face mask oxygen  Post-op Assessment: Report given to RN and Post -op Vital signs reviewed and stable  Post vital signs: Reviewed and stable  Last Vitals:  Vitals Value Taken Time  BP 109/70 04/27/2018 11:45 AM  Temp    Pulse 95 04/27/2018 11:46 AM  Resp 13 04/27/2018 11:46 AM  SpO2 97 % 04/27/2018 11:46 AM  Vitals shown include unvalidated device data.  Last Pain:  Vitals:   04/27/18 0851  TempSrc: Oral  PainSc: 0-No pain         Complications: No apparent anesthesia complications

## 2018-04-27 NOTE — Op Note (Signed)
Right Breast Radioactive seed localized excisional biopsy  Indications: This patient presents with history of abnormal right mammogram and discordant core needle biopsy  Pre-operative Diagnosis: abnormal right mammogram  Post-operative Diagnosis: Same  Surgeon: [name]   Anesthesia: General endotracheal anesthesia  ASA Class: 2  Procedure Details  The patient was seen in the Holding Room. The risks, benefits, complications, treatment options, and expected outcomes were discussed with the patient. The possibilities of bleeding, infection, the need for additional procedures, failure to diagnose a condition, and creating a complication requiring transfusion or operation were discussed with the patient. The patient concurred with the proposed plan, giving informed consent.  The site of surgery properly noted/marked. The patient was taken to Operating Room # 2, identified, and the procedure verified as Right Breast seed localized excisional biopsy. A Time Out was held and the above information confirmed.  The right breast and chest were prepped and draped in standard fashion. The lumpectomy was performed by creating a superolateral circumareolar incision near the previously placed radioactive seed.  Dissection was carried down to around the point of maximum signal intensity. The cautery was used to perform the dissection.  Hemostasis was achieved with cautery. The edges of the cavity were marked with large clips, with one each medial, lateral, inferior and superior, and two clips posteriorly.   The specimen was inked with the margin marker paint kit.    Specimen radiography confirmed inclusion of the mammographic lesion, the clip, and the seed.  The background signal in the breast was zero.  The wound was irrigated and closed with 3-0 vicryl in layers and 4-0 monocryl subcuticular suture.      Sterile dressings were applied. At the end of the operation, all sponge, instrument, and needle counts  were correct.  Findings: grossly clear surgical margins and no adenopathy  Estimated Blood Loss:  min         Specimens: right breast tissue with seed         Complications:  None; patient tolerated the procedure well.         Disposition: PACU - hemodynamically stable.         Condition: stable

## 2018-04-27 NOTE — Anesthesia Postprocedure Evaluation (Signed)
Anesthesia Post Note  Patient: Victoria Lang  Procedure(s) Performed: RADIOACTIVE SEED GUIDED EXCISIONAL BREAST BIOPSY (Right Breast)     Patient location during evaluation: PACU Anesthesia Type: General Level of consciousness: awake and alert Pain management: pain level controlled Vital Signs Assessment: post-procedure vital signs reviewed and stable Respiratory status: spontaneous breathing, nonlabored ventilation, respiratory function stable and patient connected to nasal cannula oxygen Cardiovascular status: blood pressure returned to baseline and stable Postop Assessment: no apparent nausea or vomiting Anesthetic complications: no    Last Vitals:  Vitals:   04/27/18 1315 04/27/18 1354  BP: 117/82 (!) 146/82  Pulse: 76 79  Resp: 15 16  Temp:  36.6 C  SpO2: 98% 97%    Last Pain:  Vitals:   04/27/18 1354  TempSrc: Oral  PainSc: 0-No pain                 Barnet Glasgow

## 2018-04-28 ENCOUNTER — Encounter (HOSPITAL_BASED_OUTPATIENT_CLINIC_OR_DEPARTMENT_OTHER): Payer: Self-pay | Admitting: General Surgery

## 2018-04-28 NOTE — Progress Notes (Signed)
Please let patient know that pathology is benign.

## 2018-05-10 DIAGNOSIS — S82832A Other fracture of upper and lower end of left fibula, initial encounter for closed fracture: Secondary | ICD-10-CM | POA: Diagnosis not present

## 2018-06-07 DIAGNOSIS — D485 Neoplasm of uncertain behavior of skin: Secondary | ICD-10-CM | POA: Diagnosis not present

## 2018-06-07 DIAGNOSIS — D2239 Melanocytic nevi of other parts of face: Secondary | ICD-10-CM | POA: Diagnosis not present

## 2018-06-07 DIAGNOSIS — L820 Inflamed seborrheic keratosis: Secondary | ICD-10-CM | POA: Diagnosis not present

## 2018-06-07 DIAGNOSIS — D225 Melanocytic nevi of trunk: Secondary | ICD-10-CM | POA: Diagnosis not present

## 2018-06-07 DIAGNOSIS — Z85820 Personal history of malignant melanoma of skin: Secondary | ICD-10-CM | POA: Diagnosis not present

## 2018-08-09 DIAGNOSIS — S82842D Displaced bimalleolar fracture of left lower leg, subsequent encounter for closed fracture with routine healing: Secondary | ICD-10-CM | POA: Diagnosis not present

## 2018-08-15 IMAGING — MG DIGITAL SCREENING BILATERAL MAMMOGRAM WITH TOMO AND CAD
8 series · 8 of 24 positions shown · non-contrast
Comparison: Previous exam(s).

CLINICAL DATA: Screening.

EXAM:
DIGITAL SCREENING BILATERAL MAMMOGRAM WITH TOMO AND CAD

[R MLO synth-2D]
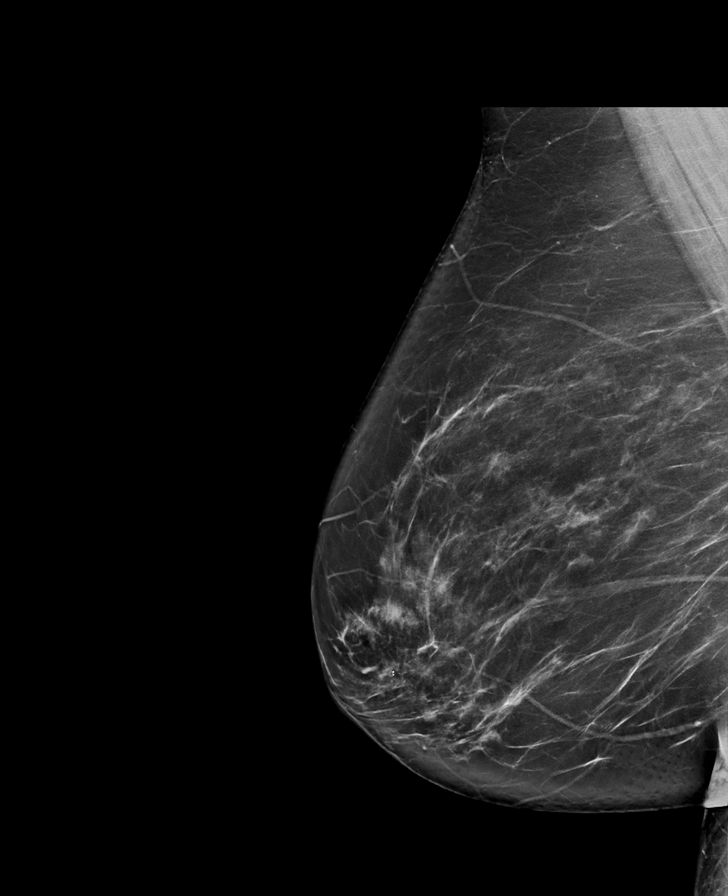

[R CC synth-2D]
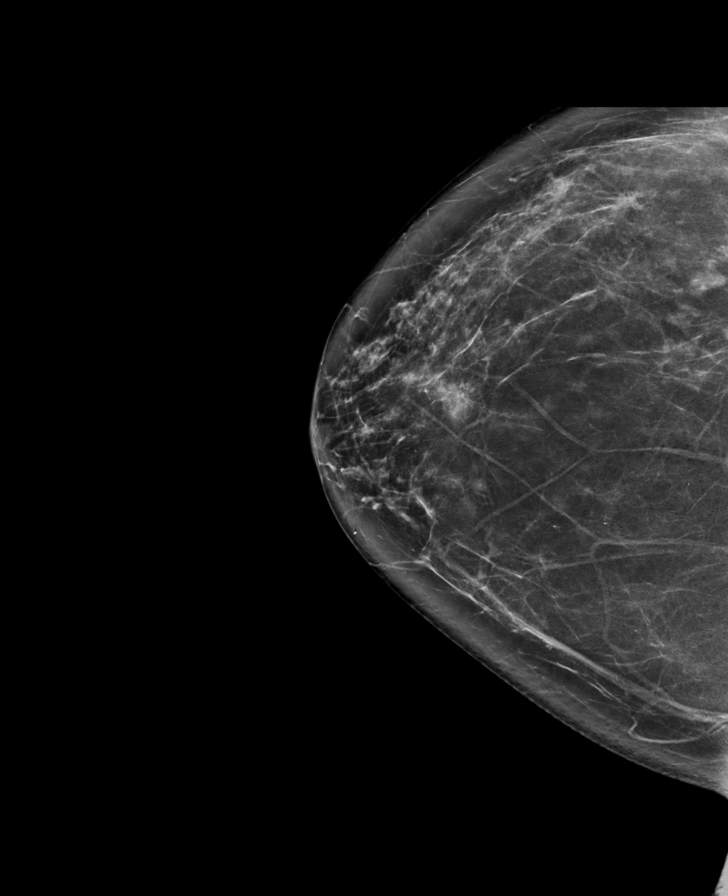

[L MLO synth-2D]
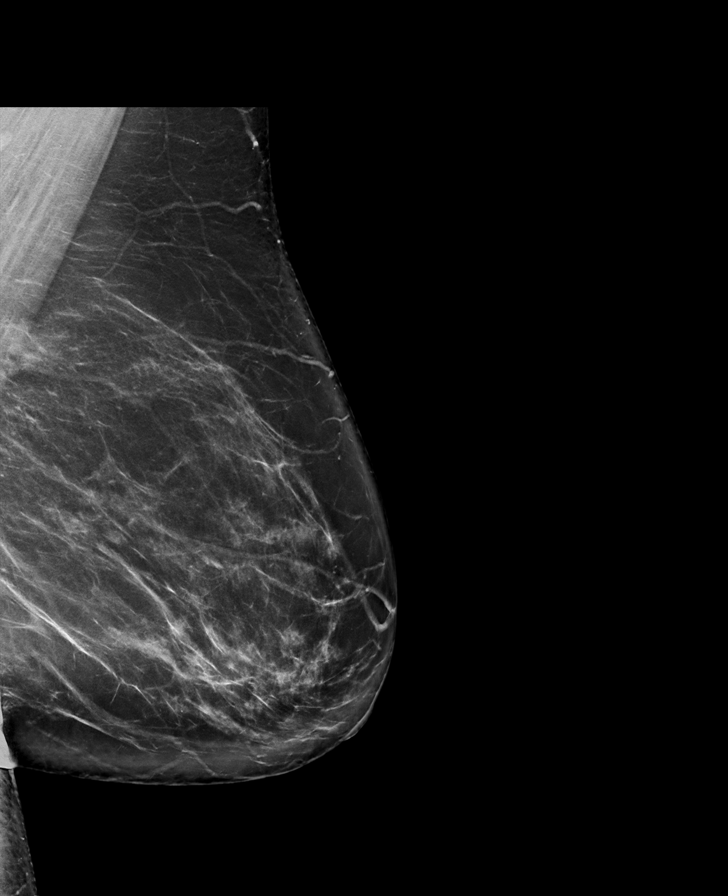

[L CC synth-2D]
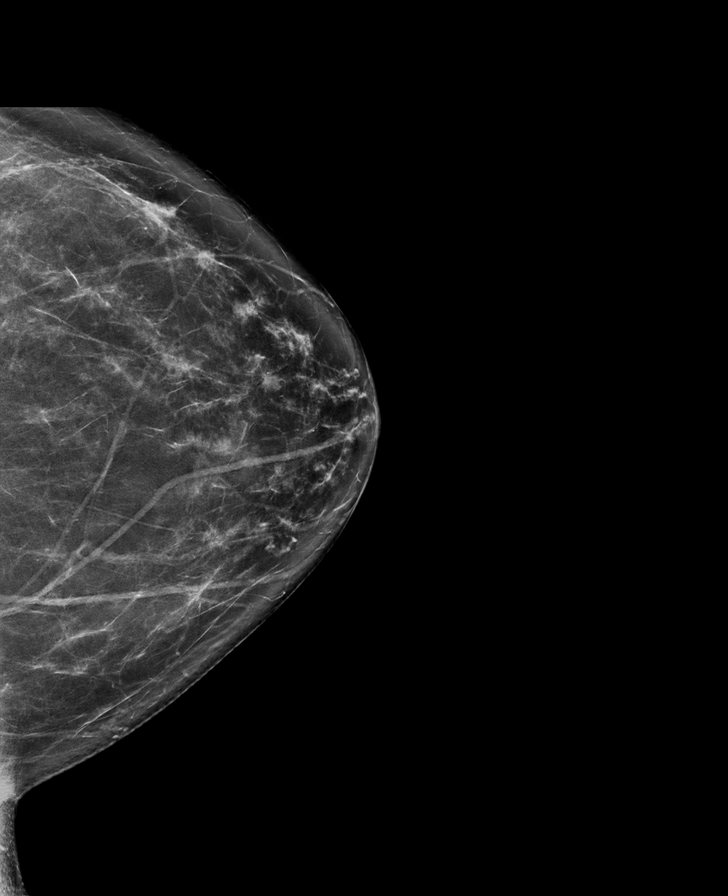

[L MLO tomo · tomo slice 45/90.0]
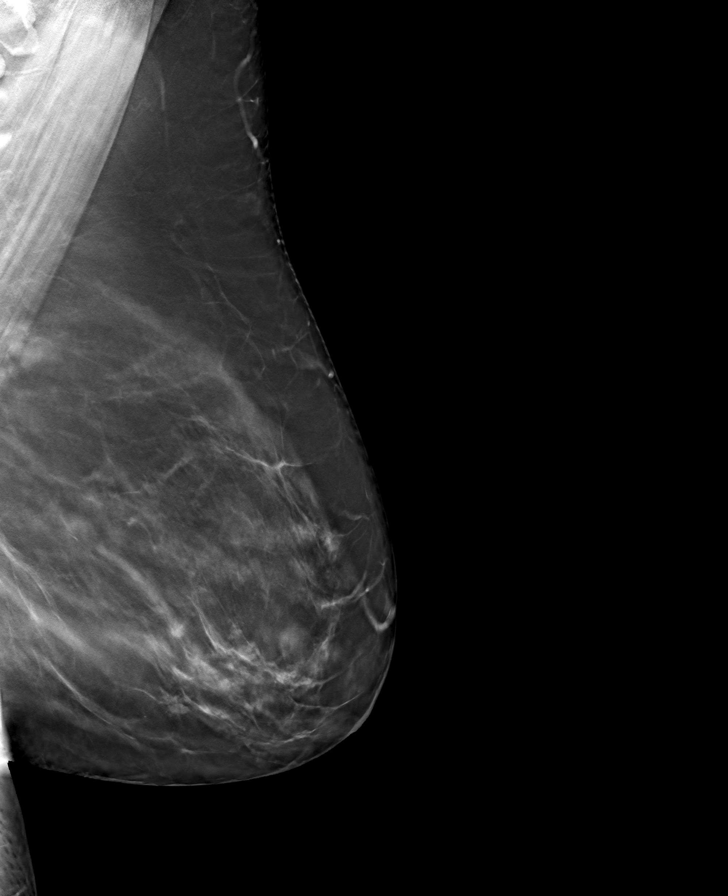

[L CC tomo · tomo slice 39/77.0]
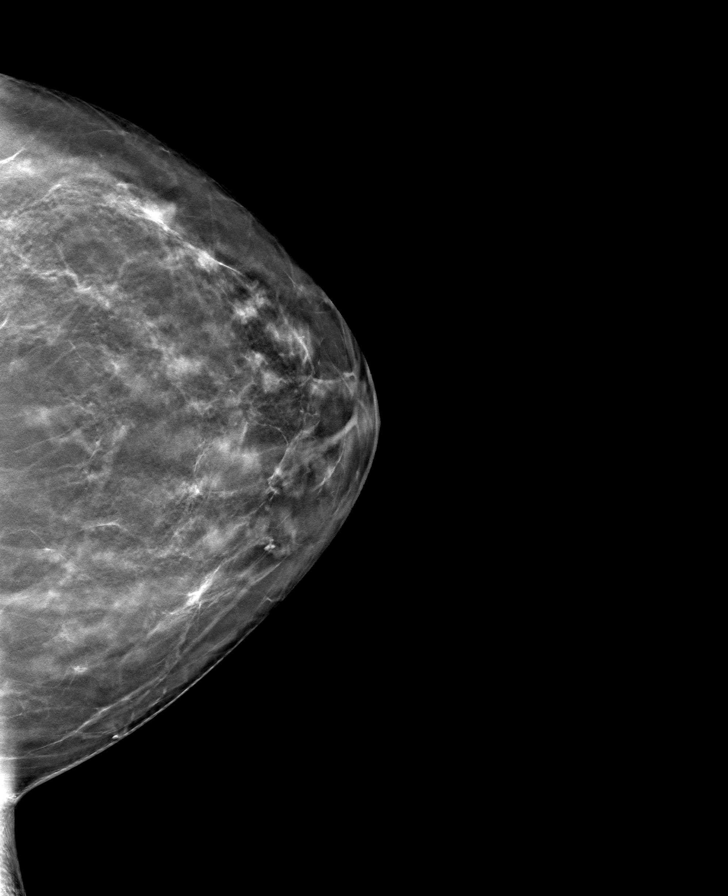

[R CC tomo · tomo slice 39/76.0]
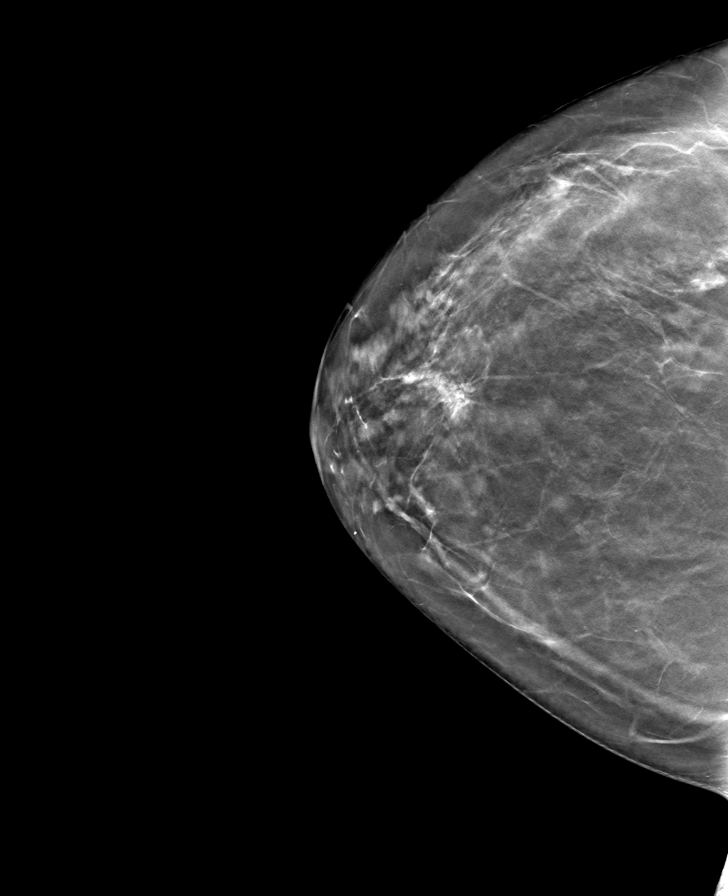

[R MLO tomo · tomo slice 44/87.0]
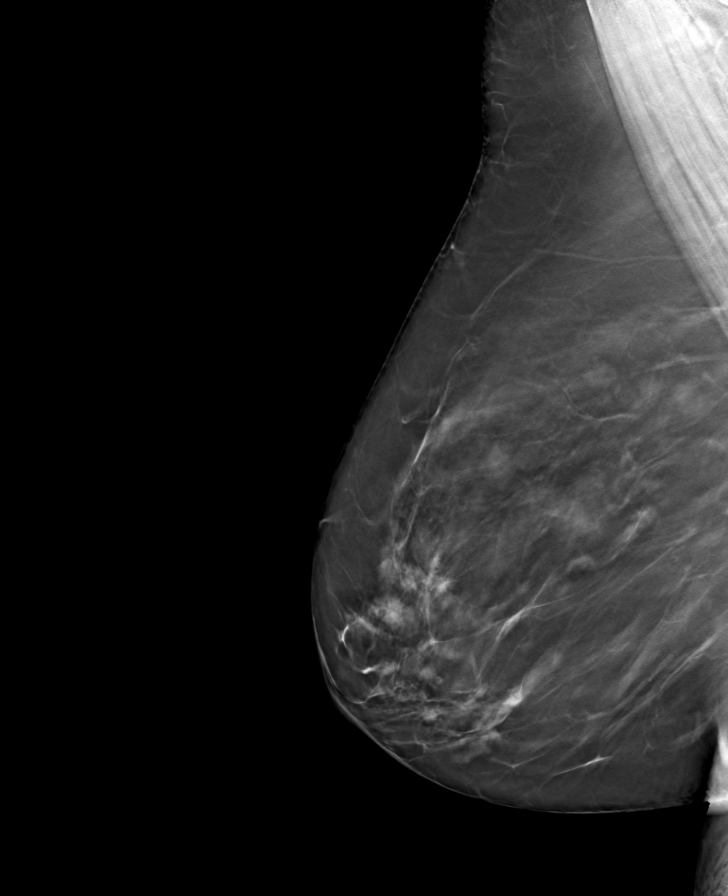

[8 of 24 positions shown; findings below may reference images not displayed]

ACR Breast Density Category b: There are scattered areas of
fibroglandular density.
FINDINGS: In the right breast, possible distortion warrants further
evaluation. In the left breast, no findings suspicious for
malignancy. Images were processed with CAD.
IMPRESSION: Further evaluation is suggested for possible distortion in the right
breast.

RECOMMENDATION:
Diagnostic mammogram and possibly ultrasound of the right breast.
(Code:[id number])

The patient will be contacted regarding the findings, and additional
imaging will be scheduled.

BI-RADS CATEGORY  0: Incomplete. Need additional imaging evaluation
and/or prior mammograms for comparison.

## 2018-08-18 DIAGNOSIS — R2689 Other abnormalities of gait and mobility: Secondary | ICD-10-CM | POA: Diagnosis not present

## 2018-08-18 DIAGNOSIS — M25672 Stiffness of left ankle, not elsewhere classified: Secondary | ICD-10-CM | POA: Diagnosis not present

## 2018-08-18 DIAGNOSIS — M25572 Pain in left ankle and joints of left foot: Secondary | ICD-10-CM | POA: Diagnosis not present

## 2018-08-23 DIAGNOSIS — M25672 Stiffness of left ankle, not elsewhere classified: Secondary | ICD-10-CM | POA: Diagnosis not present

## 2018-08-23 DIAGNOSIS — R2689 Other abnormalities of gait and mobility: Secondary | ICD-10-CM | POA: Diagnosis not present

## 2018-08-23 DIAGNOSIS — M25572 Pain in left ankle and joints of left foot: Secondary | ICD-10-CM | POA: Diagnosis not present

## 2018-08-25 DIAGNOSIS — Z01419 Encounter for gynecological examination (general) (routine) without abnormal findings: Secondary | ICD-10-CM | POA: Diagnosis not present

## 2018-08-25 DIAGNOSIS — Z6839 Body mass index (BMI) 39.0-39.9, adult: Secondary | ICD-10-CM | POA: Diagnosis not present

## 2018-08-25 DIAGNOSIS — R351 Nocturia: Secondary | ICD-10-CM | POA: Diagnosis not present

## 2018-09-09 DIAGNOSIS — I10 Essential (primary) hypertension: Secondary | ICD-10-CM | POA: Diagnosis not present

## 2018-09-09 DIAGNOSIS — Z23 Encounter for immunization: Secondary | ICD-10-CM | POA: Diagnosis not present

## 2018-09-09 DIAGNOSIS — M545 Low back pain: Secondary | ICD-10-CM | POA: Diagnosis not present

## 2018-09-09 DIAGNOSIS — Z0000 Encounter for general adult medical examination without abnormal findings: Secondary | ICD-10-CM | POA: Diagnosis not present

## 2018-09-09 DIAGNOSIS — Z13220 Encounter for screening for lipoid disorders: Secondary | ICD-10-CM | POA: Diagnosis not present

## 2018-09-15 DIAGNOSIS — M9905 Segmental and somatic dysfunction of pelvic region: Secondary | ICD-10-CM | POA: Diagnosis not present

## 2018-09-15 DIAGNOSIS — M542 Cervicalgia: Secondary | ICD-10-CM | POA: Diagnosis not present

## 2018-09-15 DIAGNOSIS — M545 Low back pain: Secondary | ICD-10-CM | POA: Diagnosis not present

## 2018-09-20 DIAGNOSIS — M545 Low back pain: Secondary | ICD-10-CM | POA: Diagnosis not present

## 2018-09-20 DIAGNOSIS — M542 Cervicalgia: Secondary | ICD-10-CM | POA: Diagnosis not present

## 2018-09-20 DIAGNOSIS — M9905 Segmental and somatic dysfunction of pelvic region: Secondary | ICD-10-CM | POA: Diagnosis not present

## 2018-09-20 DIAGNOSIS — M9903 Segmental and somatic dysfunction of lumbar region: Secondary | ICD-10-CM | POA: Diagnosis not present

## 2018-09-22 DIAGNOSIS — M542 Cervicalgia: Secondary | ICD-10-CM | POA: Diagnosis not present

## 2018-09-22 DIAGNOSIS — M9903 Segmental and somatic dysfunction of lumbar region: Secondary | ICD-10-CM | POA: Diagnosis not present

## 2018-09-22 DIAGNOSIS — M545 Low back pain: Secondary | ICD-10-CM | POA: Diagnosis not present

## 2018-09-28 DIAGNOSIS — L304 Erythema intertrigo: Secondary | ICD-10-CM | POA: Diagnosis not present

## 2018-09-28 DIAGNOSIS — Z6838 Body mass index (BMI) 38.0-38.9, adult: Secondary | ICD-10-CM | POA: Diagnosis not present

## 2018-09-28 DIAGNOSIS — M545 Low back pain: Secondary | ICD-10-CM | POA: Diagnosis not present

## 2018-09-28 DIAGNOSIS — L02214 Cutaneous abscess of groin: Secondary | ICD-10-CM | POA: Diagnosis not present

## 2018-10-04 DIAGNOSIS — M542 Cervicalgia: Secondary | ICD-10-CM | POA: Diagnosis not present

## 2018-10-04 DIAGNOSIS — M545 Low back pain: Secondary | ICD-10-CM | POA: Diagnosis not present

## 2018-10-04 DIAGNOSIS — M9905 Segmental and somatic dysfunction of pelvic region: Secondary | ICD-10-CM | POA: Diagnosis not present

## 2018-10-04 DIAGNOSIS — M9903 Segmental and somatic dysfunction of lumbar region: Secondary | ICD-10-CM | POA: Diagnosis not present

## 2018-10-13 DIAGNOSIS — L02214 Cutaneous abscess of groin: Secondary | ICD-10-CM | POA: Diagnosis not present

## 2018-10-13 DIAGNOSIS — L304 Erythema intertrigo: Secondary | ICD-10-CM | POA: Diagnosis not present

## 2018-10-13 DIAGNOSIS — Z6839 Body mass index (BMI) 39.0-39.9, adult: Secondary | ICD-10-CM | POA: Diagnosis not present

## 2018-10-13 DIAGNOSIS — Z1331 Encounter for screening for depression: Secondary | ICD-10-CM | POA: Diagnosis not present

## 2018-10-17 DIAGNOSIS — D500 Iron deficiency anemia secondary to blood loss (chronic): Secondary | ICD-10-CM | POA: Diagnosis not present

## 2018-10-20 DIAGNOSIS — K589 Irritable bowel syndrome without diarrhea: Secondary | ICD-10-CM | POA: Diagnosis not present

## 2018-10-20 DIAGNOSIS — K219 Gastro-esophageal reflux disease without esophagitis: Secondary | ICD-10-CM | POA: Diagnosis not present

## 2018-10-20 DIAGNOSIS — Z1212 Encounter for screening for malignant neoplasm of rectum: Secondary | ICD-10-CM | POA: Diagnosis not present

## 2018-10-20 DIAGNOSIS — D500 Iron deficiency anemia secondary to blood loss (chronic): Secondary | ICD-10-CM | POA: Diagnosis not present

## 2018-10-21 DIAGNOSIS — B9689 Other specified bacterial agents as the cause of diseases classified elsewhere: Secondary | ICD-10-CM | POA: Diagnosis not present

## 2018-10-21 DIAGNOSIS — L02511 Cutaneous abscess of right hand: Secondary | ICD-10-CM | POA: Diagnosis not present

## 2018-10-21 DIAGNOSIS — L03011 Cellulitis of right finger: Secondary | ICD-10-CM | POA: Diagnosis not present

## 2018-10-21 DIAGNOSIS — R600 Localized edema: Secondary | ICD-10-CM | POA: Diagnosis not present

## 2018-10-28 DIAGNOSIS — M5136 Other intervertebral disc degeneration, lumbar region: Secondary | ICD-10-CM | POA: Diagnosis not present

## 2018-10-28 DIAGNOSIS — M545 Low back pain: Secondary | ICD-10-CM | POA: Diagnosis not present

## 2018-11-06 DIAGNOSIS — R05 Cough: Secondary | ICD-10-CM | POA: Diagnosis not present

## 2018-11-06 DIAGNOSIS — J069 Acute upper respiratory infection, unspecified: Secondary | ICD-10-CM | POA: Diagnosis not present

## 2018-11-10 DIAGNOSIS — J988 Other specified respiratory disorders: Secondary | ICD-10-CM | POA: Diagnosis not present

## 2018-11-23 DIAGNOSIS — M6281 Muscle weakness (generalized): Secondary | ICD-10-CM | POA: Diagnosis not present

## 2018-11-23 DIAGNOSIS — M4716 Other spondylosis with myelopathy, lumbar region: Secondary | ICD-10-CM | POA: Diagnosis not present

## 2018-12-07 DIAGNOSIS — M6281 Muscle weakness (generalized): Secondary | ICD-10-CM | POA: Diagnosis not present

## 2018-12-07 DIAGNOSIS — M4716 Other spondylosis with myelopathy, lumbar region: Secondary | ICD-10-CM | POA: Diagnosis not present

## 2019-02-13 ENCOUNTER — Other Ambulatory Visit: Payer: Self-pay | Admitting: Family Medicine

## 2019-02-13 ENCOUNTER — Other Ambulatory Visit: Payer: Self-pay | Admitting: Internal Medicine

## 2019-02-13 DIAGNOSIS — Z1231 Encounter for screening mammogram for malignant neoplasm of breast: Secondary | ICD-10-CM

## 2019-04-11 DIAGNOSIS — R05 Cough: Secondary | ICD-10-CM | POA: Diagnosis not present

## 2019-04-11 DIAGNOSIS — I10 Essential (primary) hypertension: Secondary | ICD-10-CM | POA: Diagnosis not present

## 2019-04-11 DIAGNOSIS — Z6841 Body Mass Index (BMI) 40.0 and over, adult: Secondary | ICD-10-CM | POA: Diagnosis not present

## 2019-05-10 ENCOUNTER — Other Ambulatory Visit: Payer: Self-pay

## 2019-05-10 ENCOUNTER — Ambulatory Visit
Admission: RE | Admit: 2019-05-10 | Discharge: 2019-05-10 | Disposition: A | Discharge status: Home / Self Care | Payer: BC Managed Care – PPO | Source: Ambulatory Visit | Attending: Family Medicine | Admitting: Family Medicine

## 2019-05-10 DIAGNOSIS — Z1231 Encounter for screening mammogram for malignant neoplasm of breast: Secondary | ICD-10-CM

## 2020-05-21 ENCOUNTER — Other Ambulatory Visit: Payer: Self-pay | Admitting: Registered Nurse

## 2020-05-21 ENCOUNTER — Other Ambulatory Visit: Payer: Self-pay | Admitting: Family Medicine

## 2020-05-21 DIAGNOSIS — N6312 Unspecified lump in the right breast, upper inner quadrant: Secondary | ICD-10-CM

## 2020-05-21 DIAGNOSIS — N6325 Unspecified lump in the left breast, overlapping quadrants: Secondary | ICD-10-CM

## 2020-05-21 DIAGNOSIS — Z1231 Encounter for screening mammogram for malignant neoplasm of breast: Secondary | ICD-10-CM

## 2020-05-29 ENCOUNTER — Ambulatory Visit
Admission: RE | Admit: 2020-05-29 | Discharge: 2020-05-29 | Disposition: A | Discharge status: Home / Self Care | Payer: Medicare Other | Source: Ambulatory Visit | Attending: Registered Nurse | Admitting: Registered Nurse

## 2020-05-29 ENCOUNTER — Other Ambulatory Visit: Payer: Self-pay | Admitting: Registered Nurse

## 2020-05-29 ENCOUNTER — Other Ambulatory Visit: Payer: Self-pay

## 2020-05-29 DIAGNOSIS — N6325 Unspecified lump in the left breast, overlapping quadrants: Secondary | ICD-10-CM

## 2020-05-29 DIAGNOSIS — N6312 Unspecified lump in the right breast, upper inner quadrant: Secondary | ICD-10-CM

## 2020-05-29 DIAGNOSIS — N6320 Unspecified lump in the left breast, unspecified quadrant: Secondary | ICD-10-CM

## 2020-05-30 ENCOUNTER — Other Ambulatory Visit: Payer: Self-pay | Admitting: Registered Nurse

## 2020-05-30 ENCOUNTER — Other Ambulatory Visit: Payer: Self-pay | Admitting: Emergency Medicine

## 2020-05-30 DIAGNOSIS — N63 Unspecified lump in unspecified breast: Secondary | ICD-10-CM

## 2020-06-05 ENCOUNTER — Ambulatory Visit
Admission: RE | Admit: 2020-06-05 | Discharge: 2020-06-05 | Disposition: A | Discharge status: Home / Self Care | Payer: Medicare Other | Source: Ambulatory Visit | Attending: Registered Nurse | Admitting: Registered Nurse

## 2020-06-05 ENCOUNTER — Ambulatory Visit
Admission: RE | Admit: 2020-06-05 | Discharge: 2020-06-05 | Disposition: A | Discharge status: Home / Self Care | Payer: Medicare Other | Source: Ambulatory Visit | Attending: Emergency Medicine | Admitting: Emergency Medicine

## 2020-06-05 ENCOUNTER — Other Ambulatory Visit: Payer: Self-pay

## 2020-06-05 DIAGNOSIS — N63 Unspecified lump in unspecified breast: Secondary | ICD-10-CM

## 2020-07-05 ENCOUNTER — Other Ambulatory Visit: Payer: Self-pay | Admitting: General Surgery

## 2020-07-05 DIAGNOSIS — R928 Other abnormal and inconclusive findings on diagnostic imaging of breast: Secondary | ICD-10-CM

## 2020-07-15 ENCOUNTER — Other Ambulatory Visit: Payer: Self-pay | Admitting: General Surgery

## 2020-07-15 DIAGNOSIS — R928 Other abnormal and inconclusive findings on diagnostic imaging of breast: Secondary | ICD-10-CM

## 2020-08-05 DIAGNOSIS — R0600 Dyspnea, unspecified: Secondary | ICD-10-CM | POA: Diagnosis not present

## 2020-08-12 ENCOUNTER — Other Ambulatory Visit: Payer: Self-pay

## 2020-08-12 ENCOUNTER — Encounter (HOSPITAL_BASED_OUTPATIENT_CLINIC_OR_DEPARTMENT_OTHER): Payer: Self-pay | Admitting: General Surgery

## 2020-08-16 ENCOUNTER — Other Ambulatory Visit (HOSPITAL_COMMUNITY)
Admission: RE | Admit: 2020-08-16 | Discharge: 2020-08-16 | Disposition: A | Discharge status: Home / Self Care | Payer: Medicare Other | Source: Ambulatory Visit | Attending: General Surgery | Admitting: General Surgery

## 2020-08-16 ENCOUNTER — Encounter (HOSPITAL_BASED_OUTPATIENT_CLINIC_OR_DEPARTMENT_OTHER)
Admission: RE | Admit: 2020-08-16 | Discharge: 2020-08-16 | Disposition: A | Discharge status: Home / Self Care | Payer: Medicare Other | Source: Ambulatory Visit | Attending: General Surgery | Admitting: General Surgery

## 2020-08-16 DIAGNOSIS — Z20822 Contact with and (suspected) exposure to covid-19: Secondary | ICD-10-CM | POA: Insufficient documentation

## 2020-08-16 DIAGNOSIS — Z01810 Encounter for preprocedural cardiovascular examination: Secondary | ICD-10-CM | POA: Insufficient documentation

## 2020-08-16 DIAGNOSIS — Z01812 Encounter for preprocedural laboratory examination: Secondary | ICD-10-CM | POA: Insufficient documentation

## 2020-08-16 DIAGNOSIS — I10 Essential (primary) hypertension: Secondary | ICD-10-CM | POA: Insufficient documentation

## 2020-08-16 LAB — SARS CORONAVIRUS 2 (TAT 6-24 HRS): SARS Coronavirus 2: NEGATIVE

## 2020-08-16 NOTE — Progress Notes (Signed)

## 2020-08-19 ENCOUNTER — Other Ambulatory Visit: Payer: Self-pay

## 2020-08-19 ENCOUNTER — Ambulatory Visit
Admission: RE | Admit: 2020-08-19 | Discharge: 2020-08-19 | Disposition: A | Discharge status: Home / Self Care | Payer: Medicare Other | Source: Ambulatory Visit | Attending: General Surgery | Admitting: General Surgery

## 2020-08-19 DIAGNOSIS — R928 Other abnormal and inconclusive findings on diagnostic imaging of breast: Secondary | ICD-10-CM

## 2020-08-19 NOTE — Anesthesia Preprocedure Evaluation (Addendum)
Anesthesia Evaluation  Patient identified by MRN, date of birth, ID band Patient awake    Reviewed: Allergy & Precautions, NPO status , Patient's Chart, lab work & pertinent test results  History of Anesthesia Complications (+) PONV  Airway Mallampati: II  TM Distance: >3 FB Neck ROM: Full    Dental no notable dental hx.    Pulmonary neg pulmonary ROS,    Pulmonary exam normal breath sounds clear to auscultation       Cardiovascular Exercise Tolerance: Good hypertension,  Rhythm:Regular Rate:Normal  EKG reviewed. NSR   Neuro/Psych negative neurological ROS  negative psych ROS   GI/Hepatic Neg liver ROS, GERD  ,  Endo/Other  Morbid obesity  Renal/GU negative Renal ROS  negative genitourinary   Musculoskeletal negative musculoskeletal ROS (+)   Abdominal (+) + obese,   Peds negative pediatric ROS (+)  Hematology negative hematology ROS (+)   Anesthesia Other Findings   Reproductive/Obstetrics negative OB ROS                            Anesthesia Physical Anesthesia Plan  ASA: III  Anesthesia Plan: General   Post-op Pain Management:    Induction:   PONV Risk Score and Plan: Midazolam, Scopolamine patch - Pre-op, TIVA, Propofol infusion, Dexamethasone and Ondansetron  Airway Management Planned: LMA  Additional Equipment:   Intra-op Plan:   Post-operative Plan: Extubation in OR  Informed Consent: I have reviewed the patients History and Physical, chart, labs and discussed the procedure including the risks, benefits and alternatives for the proposed anesthesia with the patient or authorized representative who has indicated his/her understanding and acceptance.     Dental advisory given  Plan Discussed with: Anesthesiologist and CRNA  Anesthesia Plan Comments:        Anesthesia Quick Evaluation

## 2020-08-19 NOTE — H&P (Signed)
Victoria Lang Location: [facility] Patient #: [id number] DOB: [birth year] Married / Language: English / Race: White Female   History of Present Illness The patient is a 64 year old female who presents for a follow-up for Breast problems. Pt is a lovely 64 yo M who was referred for consultation by Dr. Jimmye Norman for an abnormal right mammogram June 2019. She had seed loc ex biopsy 6/26. Path was benign. SHe had no pain. She denies fever. She felt like her breast was sloshy for a few days, but this has resolved.   Pt now has an abnormal LEFT mammogram showing a possible mass with distortion in the lower inner quadrant. Core needle biopsy showed complex sclerosing lesion.    diagnostic mammogram 05/29/2020 CLINICAL DATA: Patient has palpable abnormalities bilaterally. Mass in the 1 o'clock location of the RIGHT breast and 9 o'clock location of the LEFT breast for first noted over month ago.  EXAM: DIGITAL DIAGNOSTIC BILATERAL MAMMOGRAM WITH CAD AND TOMO  ULTRASOUND BILATERAL BREAST  COMPARISON: 05/10/2019 and earlier  ACR Breast Density Category b: There are scattered areas of fibroglandular density.  FINDINGS: RIGHT BREAST:  Mammogram: No suspicious mass, distortion, or microcalcifications are identified to suggest presence of malignancy. Postoperative changes are identified in the UPPER central portion of the RIGHT breast and are stable. Spot tangential view of the 1 o'clock location RIGHT breast shows normal appearing fibrofatty tissue. Mammographic images were processed with CAD.  Physical Exam: I palpate a soft ridge of tissue in the 1 o'clock location of the RIGHT breast in the area of patient's concern. I palpate no discrete mass in this region.  Ultrasound: Targeted ultrasound is performed, showing normal appearing fibroglandular tissue in the area of patient's concern.  LEFT BREAST:  Mammogram: Spot tangential view is performed of the 9  o'clock location LEFT breast, showing normal appearing fibroglandular tissue. A possible mass associated with distortion in the LOWER INNER QUADRANT is further evaluated on spot compression views and shown to persist, measuring approximately 9 millimeters. Mammographic images were processed with CAD.  Physical Exam: I palpate no discrete abnormality in the 9 o'clock location of the LEFT breast.  Ultrasound: Targeted ultrasound is performed, showing normal appearing fibroglandular tissue in the 9 o'clock location of the LEFT breast. No sonographic correlate area of patient's concern.  Normal appearing fibroglandular tissue in the LOWER INNER QUADRANT. There is no sonographic correlate for the mass associated with distortion in the [address].  Evaluation of the LEFT axilla is negative for adenopathy.  IMPRESSION: 1. No imaging findings in the areas of patient's concerns bilaterally. 2. Small mass with associated distortion in the LOWER INNER QUADRANT of the LEFT breast without definitive sonographic correlate. 3. No LEFT axillary adenopathy.  RECOMMENDATION: Recommend stereotactic guided core biopsy of LEFT breast mass.  I have discussed the findings and recommendations with the patient. If applicable, a reminder letter will be sent to the patient regarding the next appointment.  BI-RADS CATEGORY 4: Suspicious.  LEFT Core needle pathology 06/05/2020 Breast, left, needle core biopsy, medial inferior - COMPLEX SCLEROSING LESION WITH USUAL DUCTAL HYPERPLASIA.   RIGHT Pathology 04/27/18 Diagnosis Breast, lumpectomy, Right - RADIAL SCAR. - FIBROCYSTIC CHANGES WITH CALCIFICATIONS. - HEALING BIOPSY SITE. - THERE IS NO EVIDENCE OF MALIGNANCY. - SEE COMMENT.   dx mammogram 03/18/18 ACR Breast Density Category b: There are scattered areas of fibroglandular density.  FINDINGS: An area of questionable distortion in the superior slightly lateral right breast at  middle depth  persists on today's additional spot and 3D views. Further evaluation of this area was performed with ultrasound.  Mammographic images were processed with CAD.  Targeted ultrasound is performed, showing no definite sonographic correlate for the area of distortion seen mammographically. Evaluation of the entire superior aspect was performed. Evaluation of the right axilla demonstrates no suspicious lymphadenopathy.  IMPRESSION: 1. Persistent right breast distortion without ultrasound correlate. Recommendation is for stereotactic biopsy. 2. No suspicious right axillary lymphadenopathy.  RECOMMENDATION: Stereotactic biopsy of the right breast.  I have discussed the findings and recommendations with the patient. Results were also provided in writing at the conclusion of the visit. If applicable, a reminder letter will be sent to the patient regarding the next appointment.  BI-RADS CATEGORY 4: Suspicious.  pathology 03/22/18 Diagnosis Breast, right, needle core biopsy, upper outer - FIBROCYSTIC CHANGE WITH CALCIFICATIONS. - NO MALIGNANCY IDENTIFIED.   Allergies  No Known Drug Allergies  Allergies Reconciled   Medication History  Famotidine (40MG  Tablet, Oral) Active. Ferrocite (324MG  Tablet, Oral) Active. AmLODIPine Besylate (5MG  Tablet, Oral) Active. Aspirin (325MG  Tablet DR, Oral) Active. Pantoprazole Sodium (40MG  Tablet DR, Oral) Active. Vitamin D (Ergocalciferol) (50000UNIT Capsule, Oral) Active. Medications Reconciled    Review of Systems All other systems negative  Vitals Weight: 250.2 lb Height: 65in Body Surface Area: 2.18 m Body Mass Index: 41.64 kg/m  Temp.: 24F  Pulse: 106 (Regular)  BP: 132/84(Sitting, Left Arm, Standard)       Physical Exam General Mental Status-Alert. General Appearance-Consistent with stated age. Hydration-Well hydrated. Voice-Normal.  Head and Neck Head-normocephalic,  atraumatic with no lesions or palpable masses.  Eye Sclera/Conjunctiva - Bilateral-No scleral icterus.  Chest and Lung Exam Chest and lung exam reveals -quiet, even and easy respiratory effort with no use of accessory muscles. Inspection Chest Wall - Normal. Back - normal.  Breast Note: no palpable masses in either breast. right sided scar healed well. breasts symmetric. no LAD. no nipple retraction or skin dimpling.   Cardiovascular Cardiovascular examination reveals -normal pedal pulses bilaterally. Note: regular rate and rhythm  Abdomen Inspection-Inspection Normal. Palpation/Percussion Palpation and Percussion of the abdomen reveal - Soft, Non Tender, No Rebound tenderness, No Rigidity (guarding) and No hepatosplenomegaly.  Peripheral Vascular Upper Extremity Inspection - Bilateral - Normal - No Clubbing, No Cyanosis, No Edema, Pulses Intact. Lower Extremity Palpation - Edema - Bilateral - No edema - Bilateral.  Neurologic Neurologic evaluation reveals -alert and oriented x 3 with no impairment of recent or remote memory. Mental Status-Normal.  Musculoskeletal Global Assessment -Note: no gross deformities.  Normal Exam - Left-Upper Extremity Strength Normal and Lower Extremity Strength Normal. Normal Exam - Right-Upper Extremity Strength Normal and Lower Extremity Strength Normal.  Lymphatic Head & Neck  General Head & Neck Lymphatics: Bilateral - Description - Normal. Axillary  General Axillary Region: Bilateral - Description - Normal. Tenderness - Non Tender.    Assessment & Plan ABNORMAL MAMMOGRAM OF RIGHT BREAST (R92.8) Impression: Core needle biopsy with CSL. Needs excision. Discussed risk of cancer being present.  The surgical procedure was described to the patient. I discussed the incision type and location and that we would need radiology involved on with a wire or seed marker and/or sentinel node.  The risks and benefits of the  procedure were described to the patient and she wishes to proceed.  We discussed the risks bleeding, infection, damage to other structures, need for further procedures/surgeries. We discussed the risk of seroma. The patient was advised if the area in the breast  in cancer, we may need to go back to surgery for additional tissue to obtain negative margins or for a lymph node biopsy. The patient was advised that these are the most common complications, but that others can occur as well. They were advised against taking aspirin or other anti-inflammatory agents/blood thinners the week before surgery. Current Plans Pt Education - [facility] Breast Biopsy HCI: discussed with patient and provided information. You are being scheduled for surgery- Our schedulers will call you.  You should hear from our office's scheduling department within 5 working days about the location, date, and time of surgery. We try to make accommodations for patient's preferences in scheduling surgery, but sometimes the OR schedule or the surgeon's schedule prevents [place] from making those accommodations.  If you have not heard from our office [phone]) in 5 working days, call the office and ask for your surgeon's nurse.  If you have other questions about your diagnosis, plan, or surgery, call the office and ask for your surgeon's nurse.

## 2020-08-20 ENCOUNTER — Ambulatory Visit (HOSPITAL_BASED_OUTPATIENT_CLINIC_OR_DEPARTMENT_OTHER)
Admission: RE | Admit: 2020-08-20 | Discharge: 2020-08-20 | Disposition: A | Discharge status: Home / Self Care | Payer: Medicare Other | Attending: General Surgery | Admitting: General Surgery

## 2020-08-20 ENCOUNTER — Ambulatory Visit (HOSPITAL_BASED_OUTPATIENT_CLINIC_OR_DEPARTMENT_OTHER): Payer: Medicare Other | Admitting: Anesthesiology

## 2020-08-20 ENCOUNTER — Encounter (HOSPITAL_BASED_OUTPATIENT_CLINIC_OR_DEPARTMENT_OTHER): Payer: Self-pay | Admitting: General Surgery

## 2020-08-20 ENCOUNTER — Encounter (HOSPITAL_BASED_OUTPATIENT_CLINIC_OR_DEPARTMENT_OTHER)
Admission: RE | Disposition: A | Discharge status: Home / Self Care | Payer: Self-pay | Source: Home / Self Care | Attending: General Surgery

## 2020-08-20 ENCOUNTER — Ambulatory Visit
Admission: RE | Admit: 2020-08-20 | Discharge: 2020-08-20 | Disposition: A | Discharge status: Home / Self Care | Payer: Medicare Other | Source: Ambulatory Visit | Attending: General Surgery | Admitting: General Surgery

## 2020-08-20 ENCOUNTER — Other Ambulatory Visit: Payer: Self-pay

## 2020-08-20 DIAGNOSIS — Z7982 Long term (current) use of aspirin: Secondary | ICD-10-CM | POA: Diagnosis not present

## 2020-08-20 DIAGNOSIS — N6489 Other specified disorders of breast: Secondary | ICD-10-CM | POA: Diagnosis not present

## 2020-08-20 DIAGNOSIS — Z79899 Other long term (current) drug therapy: Secondary | ICD-10-CM | POA: Insufficient documentation

## 2020-08-20 DIAGNOSIS — D242 Benign neoplasm of left breast: Secondary | ICD-10-CM | POA: Diagnosis not present

## 2020-08-20 DIAGNOSIS — N6310 Unspecified lump in the right breast, unspecified quadrant: Secondary | ICD-10-CM | POA: Insufficient documentation

## 2020-08-20 DIAGNOSIS — K219 Gastro-esophageal reflux disease without esophagitis: Secondary | ICD-10-CM | POA: Insufficient documentation

## 2020-08-20 DIAGNOSIS — Z6841 Body Mass Index (BMI) 40.0 and over, adult: Secondary | ICD-10-CM | POA: Diagnosis not present

## 2020-08-20 DIAGNOSIS — I10 Essential (primary) hypertension: Secondary | ICD-10-CM | POA: Diagnosis not present

## 2020-08-20 DIAGNOSIS — R928 Other abnormal and inconclusive findings on diagnostic imaging of breast: Secondary | ICD-10-CM

## 2020-08-20 HISTORY — DX: Dyspnea, unspecified: R06.00

## 2020-08-20 HISTORY — PX: RADIOACTIVE SEED GUIDED EXCISIONAL BREAST BIOPSY: SHX6490

## 2020-08-20 HISTORY — DX: Prediabetes: R73.03

## 2020-08-20 HISTORY — DX: Wheezing: R06.2

## 2020-08-20 SURGERY — RADIOACTIVE SEED GUIDED BREAST BIOPSY
Anesthesia: General | Site: Breast | Laterality: Left

## 2020-08-20 MED ORDER — PROPOFOL 500 MG/50ML IV EMUL
INTRAVENOUS | Status: AC
  Filled 2020-08-20: qty 50

## 2020-08-20 MED ORDER — PHENYLEPHRINE 40 MCG/ML (10ML) SYRINGE FOR IV PUSH (FOR BLOOD PRESSURE SUPPORT)
PREFILLED_SYRINGE | INTRAVENOUS | Status: DC | PRN
  Administered 2020-08-20: 80 ug via INTRAVENOUS

## 2020-08-20 MED ORDER — ONDANSETRON HCL 4 MG/2ML IJ SOLN
INTRAMUSCULAR | Status: AC
  Filled 2020-08-20: qty 2

## 2020-08-20 MED ORDER — DEXAMETHASONE SODIUM PHOSPHATE 10 MG/ML IJ SOLN
INTRAMUSCULAR | Status: DC | PRN
  Administered 2020-08-20: 10 mg via INTRAVENOUS

## 2020-08-20 MED ORDER — PROMETHAZINE HCL 25 MG/ML IJ SOLN: 6.2500 mg | INTRAMUSCULAR | Status: DC | PRN

## 2020-08-20 MED ORDER — PROPOFOL 10 MG/ML IV BOLUS
INTRAVENOUS | Status: DC | PRN
  Administered 2020-08-20: 50 mg via INTRAVENOUS
  Administered 2020-08-20: 150 mg via INTRAVENOUS

## 2020-08-20 MED ORDER — MIDAZOLAM HCL 2 MG/2ML IJ SOLN
INTRAMUSCULAR | Status: AC
  Filled 2020-08-20: qty 2

## 2020-08-20 MED ORDER — FENTANYL CITRATE (PF) 100 MCG/2ML IJ SOLN
INTRAMUSCULAR | Status: AC
  Filled 2020-08-20: qty 2

## 2020-08-20 MED ORDER — FENTANYL CITRATE (PF) 100 MCG/2ML IJ SOLN: 25.0000 ug | INTRAMUSCULAR | Status: DC | PRN

## 2020-08-20 MED ORDER — SCOPOLAMINE 1 MG/3DAYS TD PT72: 1.0000 | MEDICATED_PATCH | TRANSDERMAL | Status: DC

## 2020-08-20 MED ORDER — ONDANSETRON HCL 4 MG/2ML IJ SOLN
INTRAMUSCULAR | Status: DC | PRN
  Administered 2020-08-20: 4 mg via INTRAVENOUS

## 2020-08-20 MED ORDER — CHLORHEXIDINE GLUCONATE CLOTH 2 % EX PADS: 6.0000 | MEDICATED_PAD | Freq: Once | CUTANEOUS | Status: DC

## 2020-08-20 MED ORDER — LIDOCAINE 2% (20 MG/ML) 5 ML SYRINGE
INTRAMUSCULAR | Status: DC | PRN
  Administered 2020-08-20: 100 mg via INTRAVENOUS

## 2020-08-20 MED ORDER — PHENYLEPHRINE 40 MCG/ML (10ML) SYRINGE FOR IV PUSH (FOR BLOOD PRESSURE SUPPORT)
PREFILLED_SYRINGE | INTRAVENOUS | Status: AC
  Filled 2020-08-20: qty 10

## 2020-08-20 MED ORDER — ACETAMINOPHEN 500 MG PO TABS
1000.0000 mg | ORAL_TABLET | Freq: Once | ORAL | Status: AC
  Administered 2020-08-20: 1000 mg via ORAL

## 2020-08-20 MED ORDER — DEXAMETHASONE SODIUM PHOSPHATE 10 MG/ML IJ SOLN
INTRAMUSCULAR | Status: AC
  Filled 2020-08-20: qty 1

## 2020-08-20 MED ORDER — CEFAZOLIN SODIUM-DEXTROSE 2-4 GM/100ML-% IV SOLN
INTRAVENOUS | Status: AC
  Filled 2020-08-20: qty 100

## 2020-08-20 MED ORDER — ACETAMINOPHEN 500 MG PO TABS
ORAL_TABLET | ORAL | Status: AC
  Filled 2020-08-20: qty 2

## 2020-08-20 MED ORDER — LIDOCAINE 2% (20 MG/ML) 5 ML SYRINGE
INTRAMUSCULAR | Status: AC
  Filled 2020-08-20: qty 5

## 2020-08-20 MED ORDER — PROPOFOL 500 MG/50ML IV EMUL
INTRAVENOUS | Status: DC | PRN
  Administered 2020-08-20: 150 ug/kg/min via INTRAVENOUS

## 2020-08-20 MED ORDER — SCOPOLAMINE 1 MG/3DAYS TD PT72
MEDICATED_PATCH | TRANSDERMAL | Status: AC
  Filled 2020-08-20: qty 1

## 2020-08-20 MED ORDER — FENTANYL CITRATE (PF) 100 MCG/2ML IJ SOLN
INTRAMUSCULAR | Status: DC | PRN
Start: 2020-08-20 — End: 2020-08-20
  Administered 2020-08-20 (×4): 25 ug via INTRAVENOUS

## 2020-08-20 MED ORDER — ACETAMINOPHEN 500 MG PO TABS: 1000.0000 mg | ORAL_TABLET | ORAL | Status: DC

## 2020-08-20 MED ORDER — LACTATED RINGERS IV SOLN: INTRAVENOUS | Status: DC

## 2020-08-20 MED ORDER — CEFAZOLIN SODIUM-DEXTROSE 2-4 GM/100ML-% IV SOLN: 2.0000 g | INTRAVENOUS | Status: DC

## 2020-08-20 MED ORDER — OXYCODONE HCL 5 MG PO TABS: 5.0000 mg | ORAL_TABLET | Freq: Four times a day (QID) | ORAL | 0 refills | Status: AC | PRN

## 2020-08-20 MED ORDER — LIDOCAINE HCL 1 % IJ SOLN
INTRAMUSCULAR | Status: DC | PRN
  Administered 2020-08-20: 40 mL

## 2020-08-20 MED ORDER — MIDAZOLAM HCL 5 MG/5ML IJ SOLN
INTRAMUSCULAR | Status: DC | PRN
  Administered 2020-08-20: 2 mg via INTRAVENOUS

## 2020-08-20 MED ORDER — SCOPOLAMINE 1 MG/3DAYS TD PT72
1.0000 | MEDICATED_PATCH | TRANSDERMAL | Status: DC
  Administered 2020-08-20: 1.5 mg via TRANSDERMAL

## 2020-08-20 MED ORDER — AMISULPRIDE (ANTIEMETIC) 5 MG/2ML IV SOLN: 10.0000 mg | Freq: Once | INTRAVENOUS | Status: DC | PRN

## 2020-08-20 SURGICAL SUPPLY — 54 items
ADH SKN CLS APL DERMABOND .7 (GAUZE/BANDAGES/DRESSINGS) ×1
APL PRP STRL LF DISP 70% ISPRP (MISCELLANEOUS) ×1
BINDER BREAST 3XL (GAUZE/BANDAGES/DRESSINGS) ×2 IMPLANT
BLADE SURG 10 STRL SS (BLADE) ×2 IMPLANT
BLADE SURG 15 STRL LF DISP TIS (BLADE) ×1 IMPLANT
BLADE SURG 15 STRL SS (BLADE) ×2
CANISTER SUC SOCK COL 7IN (MISCELLANEOUS) IMPLANT
CANISTER SUCT 1200ML W/VALVE (MISCELLANEOUS) IMPLANT
CHLORAPREP W/TINT 26 (MISCELLANEOUS) ×2 IMPLANT
CLIP VESOCCLUDE LG 6/CT (CLIP) ×2 IMPLANT
COVER BACK TABLE 60X90IN (DRAPES) ×2 IMPLANT
COVER MAYO STAND STRL (DRAPES) ×2 IMPLANT
COVER PROBE W GEL 5X96 (DRAPES) ×2 IMPLANT
COVER WAND RF STERILE (DRAPES) IMPLANT
DECANTER SPIKE VIAL GLASS SM (MISCELLANEOUS) IMPLANT
DERMABOND ADVANCED (GAUZE/BANDAGES/DRESSINGS) ×1
DERMABOND ADVANCED .7 DNX12 (GAUZE/BANDAGES/DRESSINGS) ×1 IMPLANT
DRAPE LAPAROSCOPIC ABDOMINAL (DRAPES) ×2 IMPLANT
DRAPE UTILITY XL STRL (DRAPES) ×2 IMPLANT
ELECT COATED BLADE 2.86 ST (ELECTRODE) ×2 IMPLANT
ELECT REM PT RETURN 9FT ADLT (ELECTROSURGICAL) ×2
ELECTRODE REM PT RTRN 9FT ADLT (ELECTROSURGICAL) ×1 IMPLANT
GAUZE SPONGE 4X4 12PLY STRL LF (GAUZE/BANDAGES/DRESSINGS) ×2 IMPLANT
GLOVE BIO SURGEON STRL SZ 6 (GLOVE) ×2 IMPLANT
GLOVE BIO SURGEON STRL SZ 6.5 (GLOVE) ×2 IMPLANT
GLOVE BIOGEL PI IND STRL 6.5 (GLOVE) ×1 IMPLANT
GLOVE BIOGEL PI IND STRL 7.0 (GLOVE) ×1 IMPLANT
GLOVE BIOGEL PI INDICATOR 6.5 (GLOVE) ×1
GLOVE BIOGEL PI INDICATOR 7.0 (GLOVE) ×1
GOWN STRL REUS W/ TWL LRG LVL3 (GOWN DISPOSABLE) ×1 IMPLANT
GOWN STRL REUS W/TWL 2XL LVL3 (GOWN DISPOSABLE) ×2 IMPLANT
GOWN STRL REUS W/TWL LRG LVL3 (GOWN DISPOSABLE) ×2
KIT MARKER MARGIN INK (KITS) ×2 IMPLANT
LIGHT WAVEGUIDE WIDE FLAT (MISCELLANEOUS) IMPLANT
NEEDLE HYPO 25X1 1.5 SAFETY (NEEDLE) ×2 IMPLANT
NS IRRIG 1000ML POUR BTL (IV SOLUTION) ×2 IMPLANT
PACK BASIN DAY SURGERY FS (CUSTOM PROCEDURE TRAY) ×2 IMPLANT
PENCIL SMOKE EVACUATOR (MISCELLANEOUS) ×2 IMPLANT
SLEEVE SCD COMPRESS KNEE MED (MISCELLANEOUS) ×2 IMPLANT
SPONGE LAP 18X18 RF (DISPOSABLE) ×2 IMPLANT
STAPLER VISISTAT 35W (STAPLE) IMPLANT
STRIP CLOSURE SKIN 1/2X4 (GAUZE/BANDAGES/DRESSINGS) ×2 IMPLANT
SUT MON AB 4-0 PC3 18 (SUTURE) ×2 IMPLANT
SUT SILK 2 0 SH (SUTURE) IMPLANT
SUT VIC AB 2-0 SH 18 (SUTURE) IMPLANT
SUT VIC AB 3-0 SH 27 (SUTURE)
SUT VIC AB 3-0 SH 27X BRD (SUTURE) IMPLANT
SUT VICRYL 3-0 CR8 SH (SUTURE) ×2 IMPLANT
SYR BULB EAR ULCER 3OZ GRN STR (SYRINGE) ×2 IMPLANT
SYR CONTROL 10ML LL (SYRINGE) ×2 IMPLANT
TOWEL GREEN STERILE FF (TOWEL DISPOSABLE) ×2 IMPLANT
TRAY FAXITRON CT DISP (TRAY / TRAY PROCEDURE) ×2 IMPLANT
TUBE CONNECTING 20X1/4 (TUBING) IMPLANT
YANKAUER SUCT BULB TIP NO VENT (SUCTIONS) IMPLANT

## 2020-08-20 NOTE — Interval H&P Note (Signed)
History and Physical Interval Note:  08/20/2020 7:33 AM  Victoria Lang  has presented today for surgery, with the diagnosis of LEFT ABNORMAL MAMMOGRAM.  The various methods of treatment have been discussed with the patient and family. After consideration of risks, benefits and other options for treatment, the patient has consented to  Procedure(s): LEFT RADIOACTIVE SEED GUIDED EXCISIONAL BREAST BIOPSY (Left) as a surgical intervention.  The patient's history has been reviewed, patient examined, no change in status, stable for surgery.  I have reviewed the patient's chart and labs.  Questions were answered to the patient's satisfaction.     [name]

## 2020-08-20 NOTE — Op Note (Signed)
Left Breast Radioactive seed localized excisional biopsy  Indications: This patient presents with history of abnormal left mammogram with discordant core needle biopsy.    Pre-operative Diagnosis: abnormal left mammogram    Post-operative Diagnosis: abnormal left mammogram  Surgeon: [name]   Anesthesia: General endotracheal anesthesia  ASA Class: 3  Procedure Details  The patient was seen in the Holding Room. The risks, benefits, complications, treatment options, and expected outcomes were discussed with the patient. The possibilities of bleeding, infection, the need for additional procedures, failure to diagnose a condition, and creating a complication requiring transfusion or operation were discussed with the patient. The patient concurred with the proposed plan, giving informed consent.  The site of surgery properly noted/marked. The patient was taken to Operating Room # 8, identified, and the procedure verified as Left Breast seed localized excisional biopsy. A Time Out was held and the above information confirmed.  The left breast and chest were prepped and draped in standard fashion. A medial circumareolar incision was made near the previously placed radioactive seed.  Dissection was carried down around the point of maximum signal intensity. The cautery was used to perform the dissection.   The specimen was inked with the margin marker paint kit.    Specimen radiography confirmed inclusion of the mammographic lesion, the clip, and the seed.  The background signal in the breast was zero.  Two clips were placed in the breast cavity.  Hemostasis was achieved with cautery.  The wound was irrigated and closed with 3-0 vicryl interrupted deep dermal sutures and 4-0 monocryl running subcuticular suture.      Sterile dressings were applied. At the end of the operation, all sponge, instrument, and needle counts were correct.  Findings: Seed, clip in specimen  Estimated Blood Loss:  min          Specimens: left breast tissue with seed         Complications:  None; patient tolerated the procedure well.         Disposition: PACU - hemodynamically stable.         Condition: stable

## 2020-08-20 NOTE — Anesthesia Procedure Notes (Signed)
Procedure Name: LMA Insertion Date/Time: 08/20/2020 7:46 AM Performed by: Genelle Bal, CRNA Pre-anesthesia Checklist: Patient identified, Emergency Drugs available, Suction available and Patient being monitored Patient Re-evaluated:Patient Re-evaluated prior to induction Oxygen Delivery Method: Circle system utilized Preoxygenation: Pre-oxygenation with 100% oxygen Induction Type: IV induction Ventilation: Mask ventilation without difficulty LMA: LMA inserted LMA Size: 4.0 Number of attempts: 1 Airway Equipment and Method: Bite block Placement Confirmation: positive ETCO2 Tube secured with: Tape Dental Injury: Teeth and Oropharynx as per pre-operative assessment

## 2020-08-20 NOTE — Anesthesia Postprocedure Evaluation (Signed)
Anesthesia Post Note  Patient: Victoria Lang  Procedure(s) Performed: LEFT RADIOACTIVE SEED GUIDED EXCISIONAL BREAST BIOPSY (Left Breast)     Patient location during evaluation: PACU Anesthesia Type: General Level of consciousness: sedated Pain management: pain level controlled Vital Signs Assessment: post-procedure vital signs reviewed and stable Respiratory status: spontaneous breathing and respiratory function stable Cardiovascular status: stable Postop Assessment: no apparent nausea or vomiting Anesthetic complications: no   No complications documented.  Last Vitals:  Vitals:   08/20/20 0930 08/20/20 1030  BP: 138/74 (!) 157/71  Pulse: 85 84  Resp: 13 14  Temp:  36.6 C  SpO2: 97% 98%    Last Pain:  Vitals:   08/20/20 1030  TempSrc:   PainSc: 0-No pain                 Merlinda Frederick

## 2020-08-20 NOTE — Transfer of Care (Signed)
Immediate Anesthesia Transfer of Care Note  Patient: Victoria Lang  Procedure(s) Performed: LEFT RADIOACTIVE SEED GUIDED EXCISIONAL BREAST BIOPSY (Left Breast)  Patient Location: PACU  Anesthesia Type:General  Level of Consciousness: drowsy and patient cooperative  Airway & Oxygen Therapy: Patient Spontanous Breathing and Patient connected to face mask oxygen  Post-op Assessment: Report given to RN and Post -op Vital signs reviewed and stable  Post vital signs: Reviewed and stable  Last Vitals:  Vitals Value Taken Time  BP 147/91   Temp    Pulse 99 08/20/20 0836  Resp 20 08/20/20 0836  SpO2 100 % 08/20/20 0836  Vitals shown include unvalidated device data.  Last Pain:  Vitals:   08/20/20 0643  TempSrc: Oral  PainSc: 0-No pain      Patients Stated Pain Goal: 3 (65/[id number]/46 [id number])  Complications: No complications documented.

## 2020-08-20 NOTE — Discharge Instructions (Addendum)
[place] Office Phone Number [phone]  The next dose of Tylenol can be taken at 1:00pm if needed.  Post Anesthesia Home Care Instructions  Activity: Get plenty of rest for the remainder of the day. A responsible individual must stay with you for 24 hours following the procedure.  For the next 24 hours, DO NOT: -Drive a car -Paediatric nurse -Drink alcoholic beverages -Take any medication unless instructed by your physician -Make any legal decisions or sign important papers.  Meals: Start with liquid foods such as gelatin or soup. Progress to regular foods as tolerated. Avoid greasy, spicy, heavy foods. If nausea and/or vomiting occur, drink only clear liquids until the nausea and/or vomiting subsides. Call your physician if vomiting continues.  Special Instructions/Symptoms: Your throat may feel dry or sore from the anesthesia or the breathing tube placed in your throat during surgery. If this causes discomfort, gargle with warm salt water. The discomfort should disappear within 24 hours.  If you had a scopolamine patch placed behind your ear for the management of post- operative nausea and/or vomiting:  1. The medication in the patch is effective for 72 hours, after which it should be removed.  Wrap patch in a tissue and discard in the trash. Wash hands thoroughly with soap and water. 2. You may remove the patch earlier than 72 hours if you experience unpleasant side effects which may include dry mouth, dizziness or visual disturbances. 3. Avoid touching the patch. Wash your hands with soap and water after contact with the patch.        BREAST BIOPSY/ PARTIAL MASTECTOMY: POST OP INSTRUCTIONS  Always review your discharge instruction sheet given to you by the facility where your surgery was performed.  IF YOU HAVE DISABILITY OR FAMILY LEAVE FORMS, YOU MUST BRING THEM TO THE OFFICE FOR PROCESSING.  DO NOT GIVE THEM TO YOUR DOCTOR.  1. A prescription for  pain medication may be given to you upon discharge.  Take your pain medication as prescribed, if needed.  If narcotic pain medicine is not needed, then you may take acetaminophen (Tylenol) or ibuprofen (Advil) as needed. 2. Take your usually prescribed medications unless otherwise directed 3. If you need a refill on your pain medication, please contact your pharmacy.  They will contact our office to request authorization.  Prescriptions will not be filled after 5pm or on week-ends. 4. You should eat very light the first 24 hours after surgery, such as soup, crackers, pudding, etc.  Resume your normal diet the day after surgery. 5. Most patients will experience some swelling and bruising in the breast.  Ice packs and a good support bra will help.  Swelling and bruising can take several days to resolve.  6. It is common to experience some constipation if taking pain medication after surgery.  Increasing fluid intake and taking a stool softener will usually help or prevent this problem from occurring.  A mild laxative (Milk of Magnesia or Miralax) should be taken according to package directions if there are no bowel movements after 48 hours. 7. Unless discharge instructions indicate otherwise, you may remove your bandages 48 hours after surgery, and you may shower at that time.  You may have steri-strips (small skin tapes) in place directly over the incision.  These strips should be left on the skin for 7-10 days.   Any sutures or staples will be removed at the office during your follow-up visit. 8. ACTIVITIES:  You may resume regular daily activities (gradually increasing) beginning  the next day.  Wearing a good support bra or sports bra (or the breast binder) minimizes pain and swelling.  You may have sexual intercourse when it is comfortable. a. You may drive when you no longer are taking prescription pain medication, you can comfortably wear a seatbelt, and you can safely maneuver your car and apply  brakes. b. RETURN TO WORK:  __________1 week_______________ 9. You should see your doctor in the office for a follow-up appointment approximately two weeks after your surgery.  Your doctor's nurse will typically make your follow-up appointment when she calls you with your pathology report.  Expect your pathology report 2-3 business days after your surgery.  You may call to check if you do not hear from [place] after three days.   WHEN TO CALL YOUR DOCTOR: 1. Fever over 101.0 2. Nausea and/or vomiting. 3. Extreme swelling or bruising. 4. Continued bleeding from incision. 5. Increased pain, redness, or drainage from the incision.  The clinic staff is available to answer your questions during regular business hours.  Please don't hesitate to call and ask to speak to one of the nurses for clinical concerns.  If you have a medical emergency, go to the nearest emergency room or call 911.  A surgeon from [facility] is always on call at the hospital.  For further questions, please visit [email or web address]

## 2020-08-21 ENCOUNTER — Encounter (HOSPITAL_BASED_OUTPATIENT_CLINIC_OR_DEPARTMENT_OTHER): Payer: Self-pay | Admitting: General Surgery

## 2020-08-21 LAB — SURGICAL PATHOLOGY

## 2021-02-04 ENCOUNTER — Other Ambulatory Visit: Payer: Self-pay | Admitting: Family Medicine

## 2021-02-04 DIAGNOSIS — Z9889 Other specified postprocedural states: Secondary | ICD-10-CM

## 2021-02-04 DIAGNOSIS — Z1231 Encounter for screening mammogram for malignant neoplasm of breast: Secondary | ICD-10-CM

## 2021-05-27 ENCOUNTER — Other Ambulatory Visit: Payer: Self-pay | Admitting: Family Medicine

## 2021-05-29 ENCOUNTER — Other Ambulatory Visit: Payer: Self-pay | Admitting: Family Medicine

## 2021-05-29 DIAGNOSIS — N644 Mastodynia: Secondary | ICD-10-CM

## 2021-05-29 DIAGNOSIS — Z9889 Other specified postprocedural states: Secondary | ICD-10-CM

## 2021-05-29 DIAGNOSIS — Z1231 Encounter for screening mammogram for malignant neoplasm of breast: Secondary | ICD-10-CM

## 2021-06-07 ENCOUNTER — Ambulatory Visit: Payer: Medicare Other

## 2021-06-07 ENCOUNTER — Other Ambulatory Visit: Payer: Self-pay

## 2021-06-07 ENCOUNTER — Ambulatory Visit
Admission: RE | Admit: 2021-06-07 | Discharge: 2021-06-07 | Disposition: A | Discharge status: Home / Self Care | Payer: Medicare Other | Source: Ambulatory Visit | Attending: Family Medicine | Admitting: Family Medicine

## 2021-06-07 DIAGNOSIS — N644 Mastodynia: Secondary | ICD-10-CM

## 2021-12-11 ENCOUNTER — Encounter (HOSPITAL_COMMUNITY): Payer: Self-pay

## 2022-04-17 ENCOUNTER — Other Ambulatory Visit: Payer: Self-pay | Admitting: Family Medicine

## 2022-04-17 DIAGNOSIS — Z1231 Encounter for screening mammogram for malignant neoplasm of breast: Secondary | ICD-10-CM

## 2022-06-08 ENCOUNTER — Ambulatory Visit
Admission: RE | Admit: 2022-06-08 | Discharge: 2022-06-08 | Disposition: A | Discharge status: Home / Self Care | Payer: Medicare Other | Source: Ambulatory Visit | Attending: Family Medicine | Admitting: Family Medicine

## 2022-06-08 DIAGNOSIS — Z1231 Encounter for screening mammogram for malignant neoplasm of breast: Secondary | ICD-10-CM

## 2022-06-12 ENCOUNTER — Other Ambulatory Visit: Payer: Self-pay | Admitting: Family Medicine

## 2022-06-12 DIAGNOSIS — N6320 Unspecified lump in the left breast, unspecified quadrant: Secondary | ICD-10-CM

## 2022-06-22 ENCOUNTER — Ambulatory Visit
Admission: RE | Admit: 2022-06-22 | Discharge: 2022-06-22 | Disposition: A | Discharge status: Home / Self Care | Payer: Medicare Other | Source: Ambulatory Visit | Attending: Family Medicine | Admitting: Family Medicine

## 2022-06-22 DIAGNOSIS — N6320 Unspecified lump in the left breast, unspecified quadrant: Secondary | ICD-10-CM

## 2023-03-26 ENCOUNTER — Telehealth: Payer: Self-pay

## 2023-03-26 NOTE — Patient Outreach (Signed)
  Care Coordination   Initial Visit Note   03/26/2023 Name: Victoria Lang MRN: [id number] DOB: 1956-02-05  Victoria Lang is a 67 y.o. year old female who sees Lise Auer, MD for primary care. I spoke with  Candace Gallus Scaffidi by phone today.  What matters to the patients health and wellness today?  Placed call to patient to review and offer [facility] care coordination program. Patient reports she is doing well and denies any current needs.    SDOH assessments and interventions completed:  No     Care Coordination Interventions:  No, not indicated   Follow up plan: No further intervention required.   Encounter Outcome:  Pt. Refused   Rowe Pavy, RN, BSN, CEN [facility] [employer] [phone]

## 2023-05-13 ENCOUNTER — Other Ambulatory Visit: Payer: Self-pay | Admitting: Family Medicine

## 2023-05-13 DIAGNOSIS — Z1231 Encounter for screening mammogram for malignant neoplasm of breast: Secondary | ICD-10-CM

## 2023-06-25 ENCOUNTER — Ambulatory Visit
Admission: RE | Admit: 2023-06-25 | Discharge: 2023-06-25 | Disposition: A | Discharge status: Home / Self Care | Payer: Medicare Other | Source: Ambulatory Visit | Attending: Family Medicine | Admitting: Family Medicine

## 2023-06-25 DIAGNOSIS — Z1231 Encounter for screening mammogram for malignant neoplasm of breast: Secondary | ICD-10-CM

## 2024-05-15 ENCOUNTER — Other Ambulatory Visit: Payer: Self-pay | Admitting: Family Medicine

## 2024-05-15 DIAGNOSIS — Z1231 Encounter for screening mammogram for malignant neoplasm of breast: Secondary | ICD-10-CM

## 2024-06-09 ENCOUNTER — Other Ambulatory Visit: Payer: Self-pay | Admitting: Physician Assistant

## 2024-06-09 DIAGNOSIS — M545 Low back pain, unspecified: Secondary | ICD-10-CM

## 2024-06-21 NOTE — Discharge Instructions (Signed)

## 2024-06-22 ENCOUNTER — Ambulatory Visit
Admission: RE | Admit: 2024-06-22 | Discharge: 2024-06-22 | Disposition: A | Discharge status: Home / Self Care | Source: Ambulatory Visit | Attending: Physician Assistant | Admitting: Physician Assistant

## 2024-06-22 DIAGNOSIS — M545 Low back pain, unspecified: Secondary | ICD-10-CM

## 2024-06-22 MED ORDER — METHYLPREDNISOLONE ACETATE 40 MG/ML INJ SUSP (RADIOLOG
80.0000 mg | Freq: Once | INTRAMUSCULAR | Status: AC
  Administered 2024-06-22: 80 mg via EPIDURAL

## 2024-06-22 MED ORDER — IOPAMIDOL (ISOVUE-M 200) INJECTION 41%
1.0000 mL | Freq: Once | INTRAMUSCULAR | Status: AC
  Administered 2024-06-22: 1 mL via EPIDURAL

## 2024-06-26 ENCOUNTER — Ambulatory Visit
Admission: RE | Admit: 2024-06-26 | Discharge: 2024-06-26 | Disposition: A | Discharge status: Home / Self Care | Source: Ambulatory Visit | Attending: Family Medicine | Admitting: Family Medicine

## 2024-06-26 DIAGNOSIS — Z1231 Encounter for screening mammogram for malignant neoplasm of breast: Secondary | ICD-10-CM
